# Patient Record
Sex: Female | Born: 1965
Health system: Southern US, Community
[De-identification: ages and names within clinical notes are randomized; demographics above are authoritative.]

## PROBLEM LIST (undated history)

## (undated) DIAGNOSIS — J449 Chronic obstructive pulmonary disease, unspecified: Secondary | ICD-10-CM

## (undated) DIAGNOSIS — Z720 Tobacco use: Secondary | ICD-10-CM

## (undated) DIAGNOSIS — K5792 Diverticulitis of intestine, part unspecified, without perforation or abscess without bleeding: Secondary | ICD-10-CM

## (undated) DIAGNOSIS — I2510 Atherosclerotic heart disease of native coronary artery without angina pectoris: Secondary | ICD-10-CM

## (undated) DIAGNOSIS — K648 Other hemorrhoids: Secondary | ICD-10-CM

## (undated) DIAGNOSIS — Z8679 Personal history of other diseases of the circulatory system: Secondary | ICD-10-CM

## (undated) DIAGNOSIS — R0602 Shortness of breath: Secondary | ICD-10-CM

## (undated) DIAGNOSIS — F1010 Alcohol abuse, uncomplicated: Secondary | ICD-10-CM

## (undated) DIAGNOSIS — E7800 Pure hypercholesterolemia, unspecified: Secondary | ICD-10-CM

## (undated) HISTORY — DX: Atherosclerotic heart disease of native coronary artery without angina pectoris: I25.10

## (undated) HISTORY — DX: Chronic obstructive pulmonary disease, unspecified: J44.9

## (undated) HISTORY — PX: OTHER SURGICAL HISTORY: SHX169

## (undated) HISTORY — DX: Other hemorrhoids: K64.8

## (undated) HISTORY — DX: Shortness of breath: R06.02

## (undated) HISTORY — DX: Diverticulitis of intestine, part unspecified, without perforation or abscess without bleeding: K57.92

## (undated) HISTORY — DX: Personal history of other diseases of the circulatory system: Z86.79

## (undated) HISTORY — DX: Alcohol abuse, uncomplicated: F10.10

## (undated) HISTORY — DX: Pure hypercholesterolemia, unspecified: E78.00

## (undated) HISTORY — DX: Tobacco use: Z72.0

---

## 2002-06-27 ENCOUNTER — Other Ambulatory Visit: Admission: RE | Admit: 2002-06-27 | Discharge: 2002-06-27 | Payer: Self-pay | Admitting: Family Medicine

## 2007-02-23 ENCOUNTER — Encounter: Admission: RE | Admit: 2007-02-23 | Discharge: 2007-02-23 | Payer: Self-pay | Admitting: Family Medicine

## 2008-06-12 ENCOUNTER — Encounter: Admission: RE | Admit: 2008-06-12 | Discharge: 2008-06-12 | Payer: Self-pay | Admitting: Family Medicine

## 2009-01-25 ENCOUNTER — Encounter: Admission: RE | Admit: 2009-01-25 | Discharge: 2009-01-25 | Payer: Self-pay | Admitting: Family Medicine

## 2009-02-01 ENCOUNTER — Encounter: Admission: RE | Admit: 2009-02-01 | Discharge: 2009-02-01 | Payer: Self-pay | Admitting: Family Medicine

## 2009-02-20 ENCOUNTER — Encounter: Admission: RE | Admit: 2009-02-20 | Discharge: 2009-02-20 | Payer: Self-pay | Admitting: Gastroenterology

## 2010-11-05 ENCOUNTER — Encounter
Admission: RE | Admit: 2010-11-05 | Discharge: 2010-11-05 | Payer: Self-pay | Source: Home / Self Care | Attending: Family Medicine | Admitting: Family Medicine

## 2011-12-04 ENCOUNTER — Ambulatory Visit: Payer: Self-pay | Admitting: Cardiology

## 2011-12-15 ENCOUNTER — Encounter: Payer: Self-pay | Admitting: Cardiology

## 2011-12-15 ENCOUNTER — Ambulatory Visit (INDEPENDENT_AMBULATORY_CARE_PROVIDER_SITE_OTHER): Payer: BC Managed Care – PPO | Admitting: Cardiology

## 2011-12-15 VITALS — BP 142/78 | HR 84 | Ht 62.0 in | Wt 124.0 lb

## 2011-12-15 DIAGNOSIS — R0609 Other forms of dyspnea: Secondary | ICD-10-CM | POA: Insufficient documentation

## 2011-12-15 DIAGNOSIS — R0600 Dyspnea, unspecified: Secondary | ICD-10-CM

## 2011-12-15 DIAGNOSIS — E785 Hyperlipidemia, unspecified: Secondary | ICD-10-CM | POA: Insufficient documentation

## 2011-12-15 DIAGNOSIS — I10 Essential (primary) hypertension: Secondary | ICD-10-CM

## 2011-12-15 DIAGNOSIS — E7800 Pure hypercholesterolemia, unspecified: Secondary | ICD-10-CM

## 2011-12-15 DIAGNOSIS — F1010 Alcohol abuse, uncomplicated: Secondary | ICD-10-CM

## 2011-12-15 DIAGNOSIS — R0989 Other specified symptoms and signs involving the circulatory and respiratory systems: Secondary | ICD-10-CM

## 2011-12-15 DIAGNOSIS — R0789 Other chest pain: Secondary | ICD-10-CM | POA: Insufficient documentation

## 2011-12-15 DIAGNOSIS — Z8249 Family history of ischemic heart disease and other diseases of the circulatory system: Secondary | ICD-10-CM | POA: Insufficient documentation

## 2011-12-15 DIAGNOSIS — F17200 Nicotine dependence, unspecified, uncomplicated: Secondary | ICD-10-CM

## 2011-12-15 DIAGNOSIS — Z720 Tobacco use: Secondary | ICD-10-CM | POA: Insufficient documentation

## 2011-12-15 HISTORY — DX: Hyperlipidemia, unspecified: E78.5

## 2011-12-15 NOTE — Progress Notes (Signed)
Carmon Ginsberg Date of Birth: [birth year] Medical Record #[id number]  History of Present Illness: Jeanne Harris is seen at the request of Dr. Kevan Ny for evaluation of chest pain and dyspnea. She is a 46 year old white female who has multiple cardiac risk factors. She is a heavy smoker. She has a history of hypertension and hyperlipidemia. She has a family history of early coronary disease. She has been evaluated by myself in the past with stress tests in 2004 and again in 2009 which were unremarkable. She states she continues to have episodes of chest pain. This is localized to the left lateral chest and shoulder. She does note that she gets short of breath very easily and that this has progressed. Her lifestyle is really very bad. She is smoking 2 packs per day. She admits that she is a heavy drinker drinking at least 6 beers a night. She travels extensively with her job and her diet is very high in fat and sodium. She is concerned about her cardiac risk.  No current outpatient prescriptions on file prior to visit.    Allergies  Allergen Reactions  . Other     Nitrous Oxide    Past Medical History  Diagnosis Date  . Chest pain   . History of hypertension   . Hypercholesterolemia     Mild  . Tobacco abuse   . Chronic bronchitis   . SOB (shortness of breath)     when having chest pain    No past surgical history on file.  History  Smoking status  . Current Everyday Smoker -- 2.0 packs/day  . Types: Cigarettes  Smokeless tobacco  . Not on file    History  Alcohol Use  . Yes    Family History  Problem Relation Age of Onset  . Cancer Mother     Breast Cancer  . Hypertension Mother   . Heart attack Father     X4  . Heart disease Father   . Emphysema Father   . Cancer Father     Prostate Cancer  . Hypertension Sister   . Hypertension Brother   . Hypertension Sister   . Thyroid disease Sister     Review of Systems: The review of systems is positive for inflammation of  her: She states she is under a lot of stress. She has a history of benign positional vertigo with occasional dizziness.  All other systems were reviewed and are negative.  Physical Exam: BP 142/78  Pulse 84  Ht 5\' 2"  (1.575 m)  Wt 124 lb (56.246 kg)  BMI 22.68 kg/m2 The patient is alert and oriented x 3.  The mood and affect are normal.  The skin is warm and dry.  Color is normal.  The HEENT exam reveals that the sclera are nonicteric.  The mucous membranes are moist.  The carotids are 2+ without bruits.  There is no thyromegaly.  There is no JVD.  The lungs are clear.  The chest wall is non tender.  The heart exam reveals a regular rate with a normal S1 and S2.  There are no murmurs, gallops, or rubs.  The PMI is not displaced.   Abdominal exam reveals good bowel sounds.  There is no guarding or rebound.  There is no hepatosplenomegaly or tenderness.  There are no masses.  Exam of the legs reveal no clubbing, cyanosis, or edema.  The legs are without rashes.  The distal pulses are intact.  Cranial nerves II - XII are  intact.  Motor and sensory functions are intact.  The gait is normal.  LABORATORY DATA: ECG today shows normal sinus rhythm with an incomplete right bundle branch block.  Assessment / Plan:

## 2011-12-15 NOTE — Patient Instructions (Signed)
We will schedule you for a stress Echo to evaluate your cardiac risk  You need to quit smoking and drinking alcohol.  Eat a heart healthy diet with a lot of fruits and vegetables, low red meats, and get plenty of exercise.

## 2011-12-15 NOTE — Assessment & Plan Note (Signed)
I stressed the importance of smoking sensation for her long-term health. I suspect that her recent symptoms of dyspnea are related to her chronic tobacco abuse more than her cardiac situation.

## 2011-12-15 NOTE — Assessment & Plan Note (Signed)
I reviewed the. component of a heart healthy diet with a diet rich in fruits and vegetables and less red meat.

## 2011-12-15 NOTE — Assessment & Plan Note (Signed)
Her symptoms of chest pain are atypical but she does have multiple cardiac risk factors and a very unhealthy lifestyle. I recommended a stress echo to reevaluate her cardiac risk. If this is negative then I stressed the importance of lifestyle modification.

## 2011-12-23 ENCOUNTER — Ambulatory Visit (HOSPITAL_COMMUNITY): Payer: BC Managed Care – PPO | Attending: Cardiovascular Disease

## 2011-12-23 ENCOUNTER — Ambulatory Visit (HOSPITAL_COMMUNITY): Payer: BC Managed Care – PPO | Attending: Cardiovascular Disease | Admitting: Radiology

## 2011-12-23 DIAGNOSIS — R072 Precordial pain: Secondary | ICD-10-CM

## 2011-12-23 DIAGNOSIS — R0600 Dyspnea, unspecified: Secondary | ICD-10-CM

## 2011-12-23 DIAGNOSIS — R0989 Other specified symptoms and signs involving the circulatory and respiratory systems: Secondary | ICD-10-CM

## 2011-12-23 DIAGNOSIS — Z8249 Family history of ischemic heart disease and other diseases of the circulatory system: Secondary | ICD-10-CM

## 2011-12-23 DIAGNOSIS — Z720 Tobacco use: Secondary | ICD-10-CM

## 2011-12-23 DIAGNOSIS — R0609 Other forms of dyspnea: Secondary | ICD-10-CM | POA: Insufficient documentation

## 2011-12-23 DIAGNOSIS — F17200 Nicotine dependence, unspecified, uncomplicated: Secondary | ICD-10-CM | POA: Insufficient documentation

## 2011-12-23 DIAGNOSIS — R0789 Other chest pain: Secondary | ICD-10-CM

## 2011-12-23 DIAGNOSIS — R079 Chest pain, unspecified: Secondary | ICD-10-CM | POA: Insufficient documentation

## 2012-03-04 ENCOUNTER — Other Ambulatory Visit: Payer: Self-pay | Admitting: Family Medicine

## 2012-03-04 DIAGNOSIS — Z1231 Encounter for screening mammogram for malignant neoplasm of breast: Secondary | ICD-10-CM

## 2012-03-14 ENCOUNTER — Ambulatory Visit
Admission: RE | Admit: 2012-03-14 | Discharge: 2012-03-14 | Disposition: A | Discharge status: Home / Self Care | Payer: BC Managed Care – PPO | Source: Ambulatory Visit | Attending: Family Medicine | Admitting: Family Medicine

## 2012-03-14 DIAGNOSIS — Z1231 Encounter for screening mammogram for malignant neoplasm of breast: Secondary | ICD-10-CM

## 2012-10-16 HISTORY — PX: DOBUTAMINE STRESS ECHO: SHX5426

## 2012-10-27 ENCOUNTER — Other Ambulatory Visit: Payer: Self-pay

## 2012-10-27 ENCOUNTER — Ambulatory Visit
Admission: RE | Admit: 2012-10-27 | Discharge: 2012-10-27 | Disposition: A | Discharge status: Home / Self Care | Payer: BC Managed Care – PPO | Source: Ambulatory Visit

## 2012-10-27 DIAGNOSIS — R109 Unspecified abdominal pain: Secondary | ICD-10-CM

## 2012-10-27 DIAGNOSIS — K5900 Constipation, unspecified: Secondary | ICD-10-CM

## 2012-10-27 MED ORDER — IOHEXOL 300 MG/ML  SOLN
100.0000 mL | Freq: Once | INTRAMUSCULAR | Status: AC | PRN
  Administered 2012-10-27: 100 mL via INTRAVENOUS

## 2013-06-27 ENCOUNTER — Other Ambulatory Visit: Payer: Self-pay

## 2013-06-27 DIAGNOSIS — Z1231 Encounter for screening mammogram for malignant neoplasm of breast: Secondary | ICD-10-CM

## 2013-07-10 ENCOUNTER — Ambulatory Visit
Admission: RE | Admit: 2013-07-10 | Discharge: 2013-07-10 | Disposition: A | Discharge status: Home / Self Care | Payer: BC Managed Care – PPO | Source: Ambulatory Visit

## 2013-07-10 DIAGNOSIS — Z1231 Encounter for screening mammogram for malignant neoplasm of breast: Secondary | ICD-10-CM

## 2013-09-18 IMAGING — CT CT ABD-PELV W/ CM
2 of 5 series · 17 of 46 positions shown, 19 images · IV contrast (READICAT/WATER & [ID] OMNI 300)
Comparison: 02/01/2009.

CLINICAL DATA: Abdominal pain. Left lower quadrant pain.

CT ABDOMEN AND PELVIS WITH CONTRAST
TECHNIQUE: Multidetector CT imaging of the abdomen and pelvis was
performed following the standard protocol during bolus
administration of intravenous contrast.
Contrast: 100mL OMNIPAQUE IOHEXOL 300 MG/ML  SOLN

[Series 2: abd/pelvis with · axial · 0.70mm/px · z∈[-380,-20]mm · 14 of 82 slices shown, 16 images]
[im 5/82  soft-tissue]
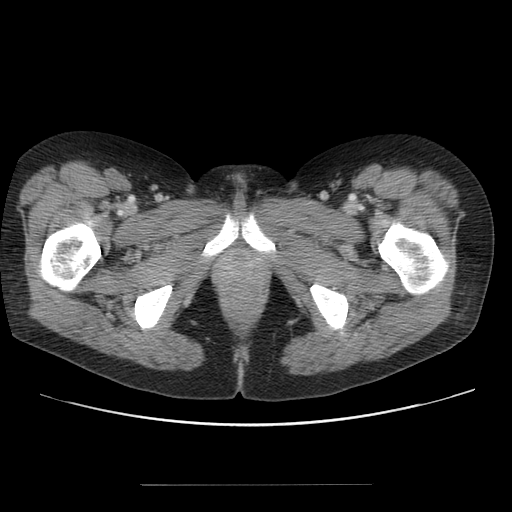
[im 5/82  bone]
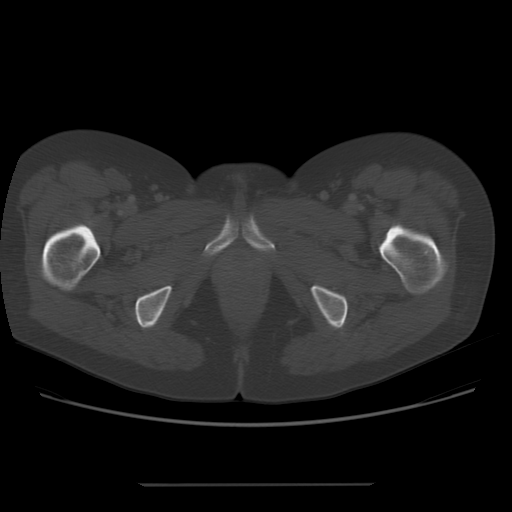
[im 9/82  soft-tissue]
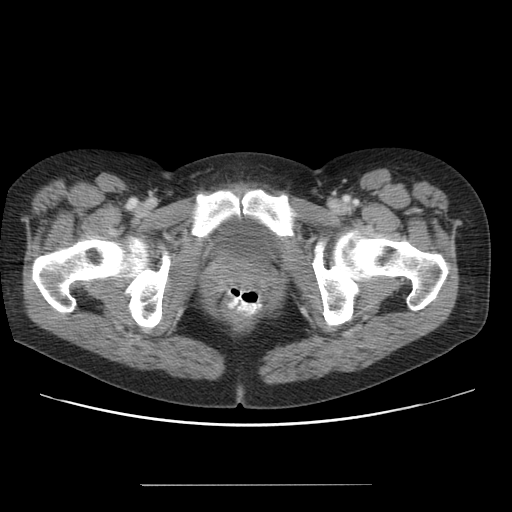
[im 18/82  soft-tissue]
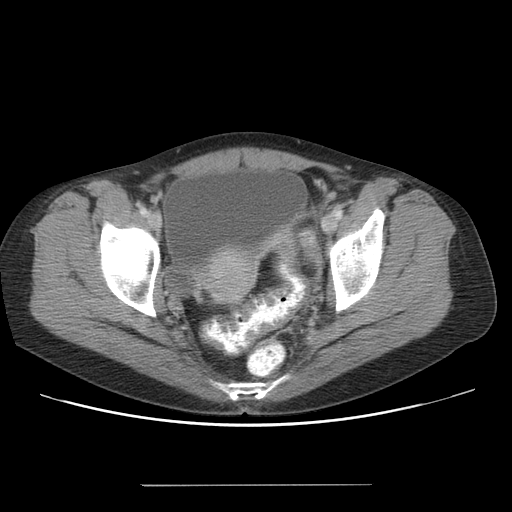
[im 22/82  soft-tissue]
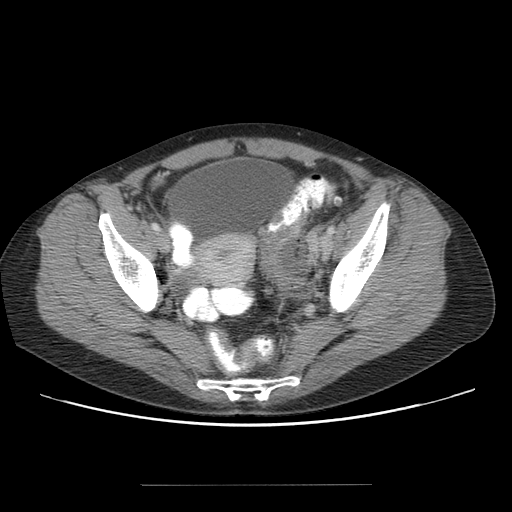
[im 26/82  soft-tissue]
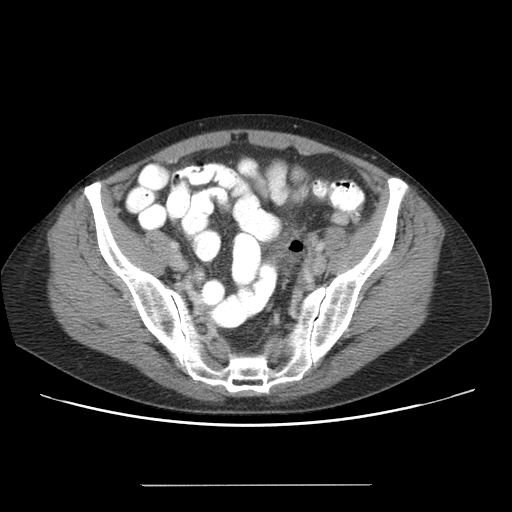
[im 35/82  soft-tissue]
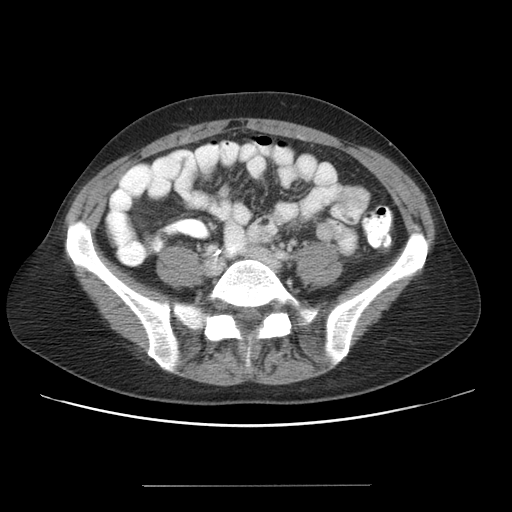
[im 39/82  soft-tissue]
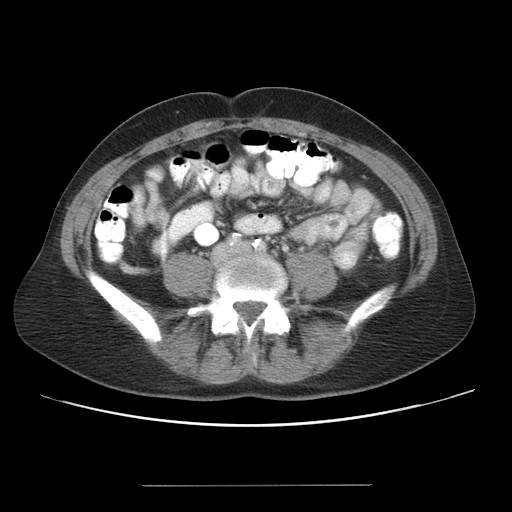
[im 43/82  soft-tissue]
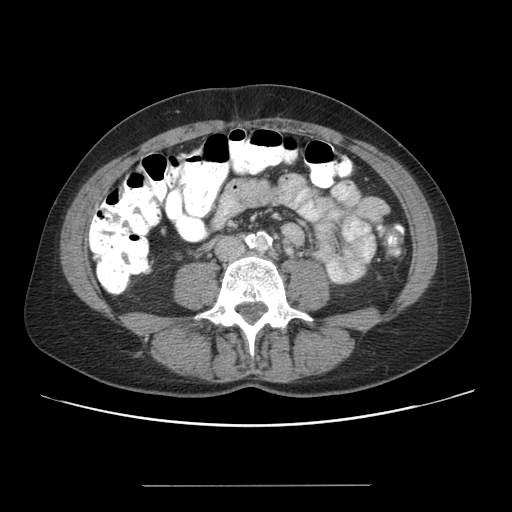
[im 47/82  soft-tissue]
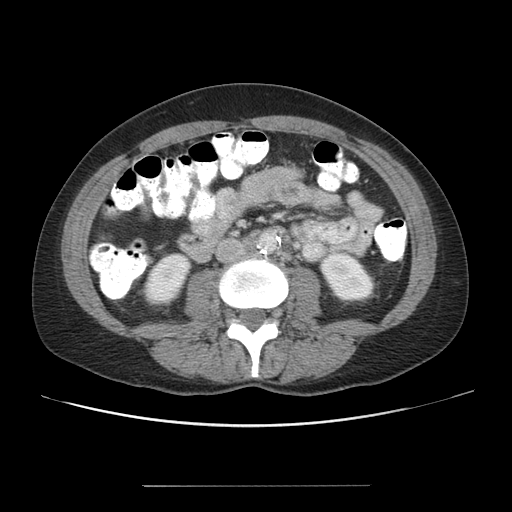
[im 47/82  bone]
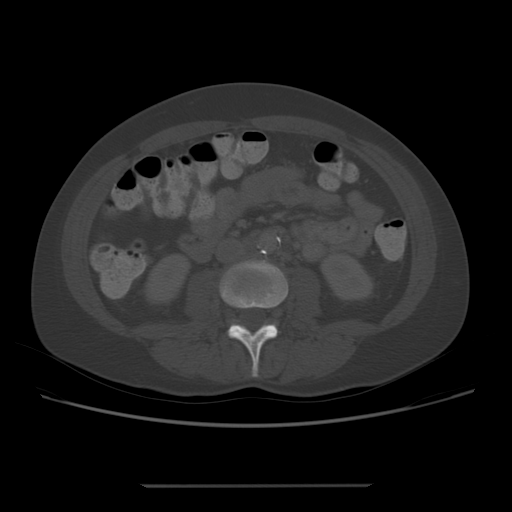
[im 56/82  soft-tissue]
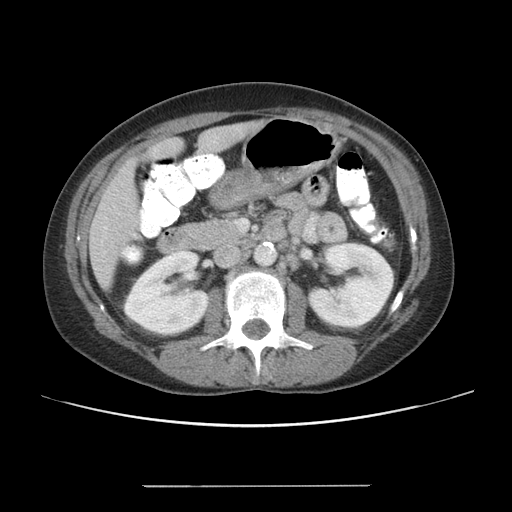
[im 60/82  soft-tissue]
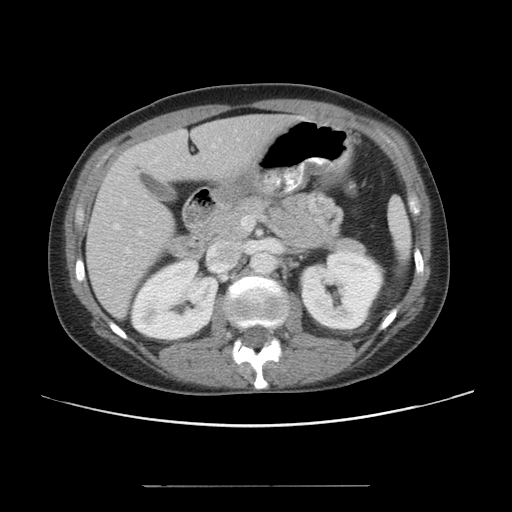
[im 64/82  soft-tissue]
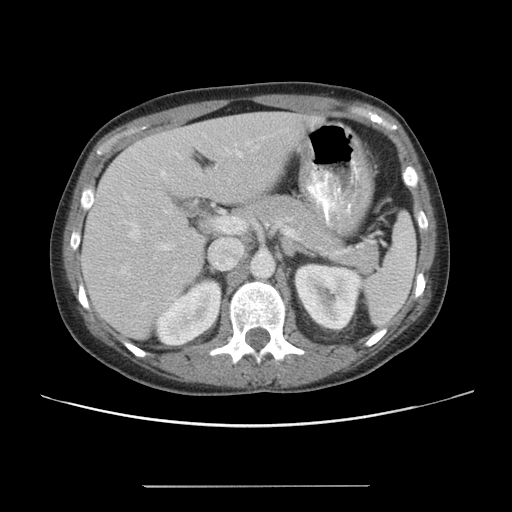
[im 73/82  soft-tissue]
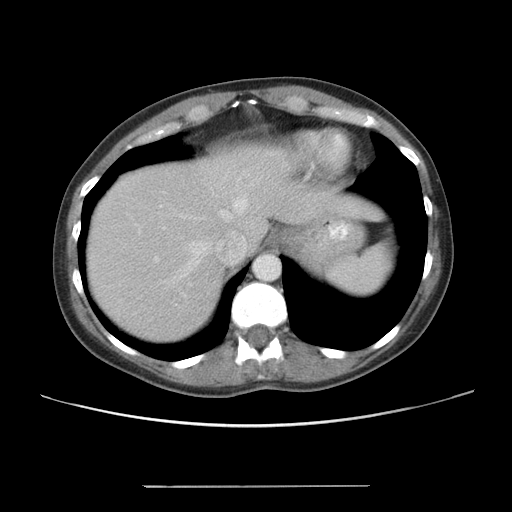
[im 77/82  soft-tissue]
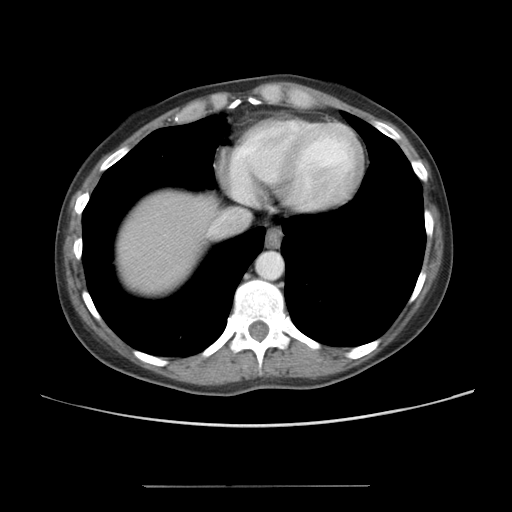

[Series 400: cor · coronal · 0.85mm/px · 3 of 104 slices shown]
[im 35/104  soft-tissue]
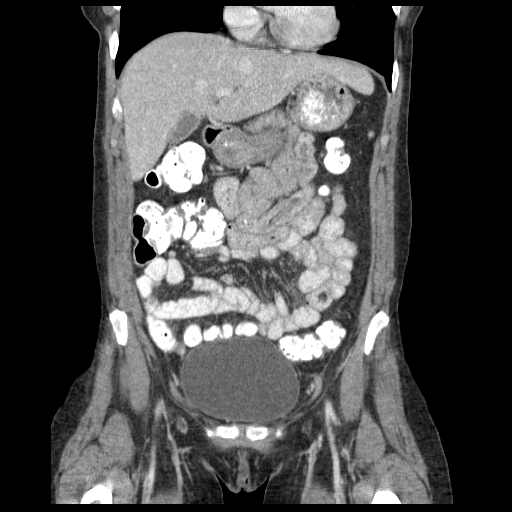
[im 46/104  soft-tissue]
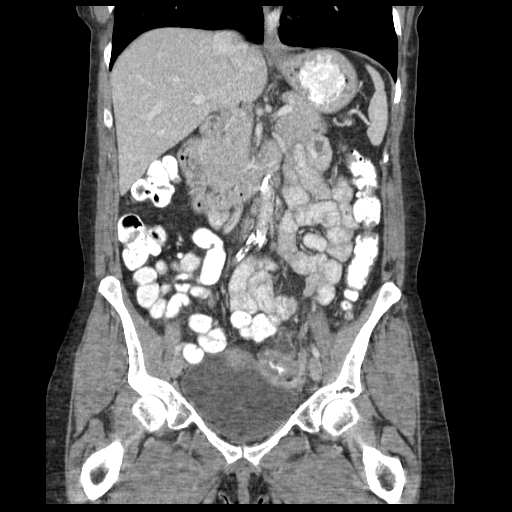
[im 58/104  soft-tissue]
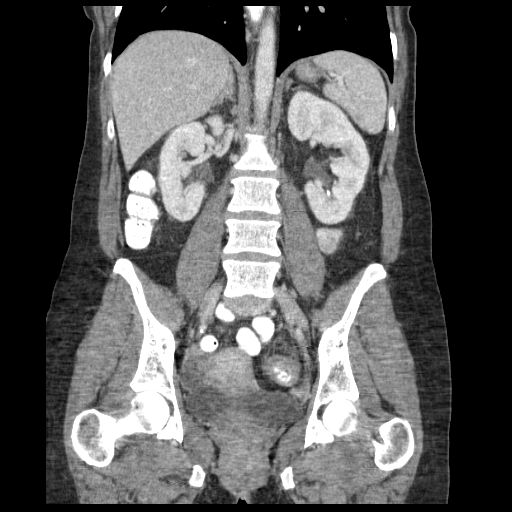

[17 of 46 positions shown; findings below may reference images not displayed]

FINDINGS: Lung bases are clear.  No effusions.  Heart is normal
size.

Liver, spleen, pancreas, stomach, adrenals are unremarkable.  Small
nonobstructing stone in the lower pole of the left kidney.  Tiny
hypodensities within the left kidney, likely small cysts.  No
hydronephrosis.  No ureteral stones.

There is a descending colonic and sigmoid diverticulosis.  Wall
thickening and surrounding inflammatory change about the sigmoid
colon compatible with active diverticulitis.  There is a focal area
of ill-defined fluid / phlegmon measuring 2.7 x 2.4 cm.  This
contains locules of air.  Findings compatible with developing
abscess adjacent to the sigmoid colon.

Uterus and adnexa are unremarkable.  The no free fluid or
adenopathy.  Aorta is calcified, non-aneurysmal.

Small bowel is decompressed.  Appendix is visualized and is normal.
IMPRESSION: Diverticulosis in the sigmoid and descending colon.  Active
diverticulitis in the sigmoid colon with adjacent phlegmon and
extraluminal air, compatible with developing abscess.

Left nephrolithiasis, stable.

These results were discussed with Dr. Asendua at the time of
interpretation.

## 2014-05-21 ENCOUNTER — Encounter: Payer: Self-pay | Admitting: Gynecology

## 2014-07-30 ENCOUNTER — Other Ambulatory Visit: Payer: Self-pay

## 2014-07-30 DIAGNOSIS — Z1231 Encounter for screening mammogram for malignant neoplasm of breast: Secondary | ICD-10-CM

## 2014-07-30 DIAGNOSIS — Z803 Family history of malignant neoplasm of breast: Secondary | ICD-10-CM

## 2014-08-09 ENCOUNTER — Encounter (INDEPENDENT_AMBULATORY_CARE_PROVIDER_SITE_OTHER): Payer: Self-pay

## 2014-08-09 ENCOUNTER — Ambulatory Visit
Admission: RE | Admit: 2014-08-09 | Discharge: 2014-08-09 | Disposition: A | Discharge status: Home / Self Care | Payer: BC Managed Care – PPO | Source: Ambulatory Visit

## 2014-08-09 DIAGNOSIS — Z1231 Encounter for screening mammogram for malignant neoplasm of breast: Secondary | ICD-10-CM

## 2014-08-09 DIAGNOSIS — Z803 Family history of malignant neoplasm of breast: Secondary | ICD-10-CM

## 2014-09-21 ENCOUNTER — Encounter: Payer: Self-pay | Admitting: Cardiology

## 2014-10-24 ENCOUNTER — Encounter: Payer: Self-pay | Admitting: Cardiology

## 2015-09-11 ENCOUNTER — Other Ambulatory Visit: Payer: Self-pay

## 2015-09-11 DIAGNOSIS — Z1231 Encounter for screening mammogram for malignant neoplasm of breast: Secondary | ICD-10-CM

## 2015-10-18 ENCOUNTER — Ambulatory Visit
Admission: RE | Admit: 2015-10-18 | Discharge: 2015-10-18 | Disposition: A | Discharge status: Home / Self Care | Payer: BLUE CROSS/BLUE SHIELD | Source: Ambulatory Visit

## 2015-10-18 DIAGNOSIS — Z1231 Encounter for screening mammogram for malignant neoplasm of breast: Secondary | ICD-10-CM

## 2016-01-13 ENCOUNTER — Other Ambulatory Visit: Payer: Self-pay | Admitting: Family Medicine

## 2016-01-13 ENCOUNTER — Ambulatory Visit
Admission: RE | Admit: 2016-01-13 | Discharge: 2016-01-13 | Disposition: A | Discharge status: Home / Self Care | Payer: BLUE CROSS/BLUE SHIELD | Source: Ambulatory Visit | Attending: Family Medicine | Admitting: Family Medicine

## 2016-01-13 DIAGNOSIS — R05 Cough: Secondary | ICD-10-CM

## 2016-01-13 DIAGNOSIS — R0602 Shortness of breath: Secondary | ICD-10-CM

## 2016-01-13 DIAGNOSIS — R059 Cough, unspecified: Secondary | ICD-10-CM

## 2016-01-13 DIAGNOSIS — F17200 Nicotine dependence, unspecified, uncomplicated: Secondary | ICD-10-CM

## 2016-06-25 ENCOUNTER — Other Ambulatory Visit: Payer: Self-pay | Admitting: Family Medicine

## 2016-06-25 ENCOUNTER — Ambulatory Visit
Admission: RE | Admit: 2016-06-25 | Discharge: 2016-06-25 | Disposition: A | Discharge status: Home / Self Care | Payer: BLUE CROSS/BLUE SHIELD | Source: Ambulatory Visit | Attending: Family Medicine | Admitting: Family Medicine

## 2016-06-25 DIAGNOSIS — R0989 Other specified symptoms and signs involving the circulatory and respiratory systems: Secondary | ICD-10-CM

## 2016-11-11 ENCOUNTER — Other Ambulatory Visit: Payer: Self-pay | Admitting: Family Medicine

## 2016-11-11 DIAGNOSIS — Z1231 Encounter for screening mammogram for malignant neoplasm of breast: Secondary | ICD-10-CM

## 2016-12-08 ENCOUNTER — Encounter: Payer: Self-pay | Admitting: Obstetrics & Gynecology

## 2016-12-09 ENCOUNTER — Ambulatory Visit: Payer: BLUE CROSS/BLUE SHIELD

## 2016-12-09 ENCOUNTER — Ambulatory Visit
Admission: RE | Admit: 2016-12-09 | Discharge: 2016-12-09 | Disposition: A | Discharge status: Home / Self Care | Payer: BLUE CROSS/BLUE SHIELD | Source: Ambulatory Visit | Attending: Family Medicine | Admitting: Family Medicine

## 2016-12-09 DIAGNOSIS — Z1231 Encounter for screening mammogram for malignant neoplasm of breast: Secondary | ICD-10-CM

## 2017-02-09 ENCOUNTER — Encounter: Payer: Self-pay | Admitting: Obstetrics & Gynecology

## 2017-04-15 ENCOUNTER — Encounter: Payer: Self-pay | Admitting: Obstetrics & Gynecology

## 2017-05-11 ENCOUNTER — Encounter: Payer: Self-pay | Admitting: Obstetrics & Gynecology

## 2017-05-20 ENCOUNTER — Encounter: Payer: Self-pay | Admitting: Obstetrics & Gynecology

## 2017-05-24 ENCOUNTER — Other Ambulatory Visit (HOSPITAL_COMMUNITY)
Admission: RE | Admit: 2017-05-24 | Discharge: 2017-05-24 | Disposition: A | Discharge status: Home / Self Care | Payer: BLUE CROSS/BLUE SHIELD | Source: Ambulatory Visit | Attending: Obstetrics & Gynecology | Admitting: Obstetrics & Gynecology

## 2017-05-24 ENCOUNTER — Ambulatory Visit (INDEPENDENT_AMBULATORY_CARE_PROVIDER_SITE_OTHER): Payer: BLUE CROSS/BLUE SHIELD | Admitting: Obstetrics & Gynecology

## 2017-05-24 ENCOUNTER — Encounter: Payer: Self-pay | Admitting: Obstetrics & Gynecology

## 2017-05-24 VITALS — BP 163/96 | HR 75 | Resp 14 | Ht 62.0 in | Wt 128.1 lb

## 2017-05-24 DIAGNOSIS — K5730 Diverticulosis of large intestine without perforation or abscess without bleeding: Secondary | ICD-10-CM

## 2017-05-24 DIAGNOSIS — F17200 Nicotine dependence, unspecified, uncomplicated: Secondary | ICD-10-CM | POA: Diagnosis not present

## 2017-05-24 DIAGNOSIS — Z9189 Other specified personal risk factors, not elsewhere classified: Secondary | ICD-10-CM

## 2017-05-24 DIAGNOSIS — Z800 Family history of malignant neoplasm of digestive organs: Secondary | ICD-10-CM | POA: Diagnosis not present

## 2017-05-24 DIAGNOSIS — Z803 Family history of malignant neoplasm of breast: Secondary | ICD-10-CM

## 2017-05-24 DIAGNOSIS — Z01419 Encounter for gynecological examination (general) (routine) without abnormal findings: Secondary | ICD-10-CM | POA: Diagnosis not present

## 2017-05-24 DIAGNOSIS — Z124 Encounter for screening for malignant neoplasm of cervix: Secondary | ICD-10-CM

## 2017-05-24 DIAGNOSIS — J449 Chronic obstructive pulmonary disease, unspecified: Secondary | ICD-10-CM | POA: Diagnosis not present

## 2017-05-24 NOTE — Patient Instructions (Signed)
Dr. Vassie LollAlva.  [facility] on [address]. Remember to get a tetenus shot if you have an exposure.

## 2017-05-24 NOTE — Progress Notes (Signed)
51 y.o. G0P0000 SingleCaucasianF here for annual exam.  Partner of existing patient of mind.  PMP.  No HRT use.  Has not had a cycle in two years.  Does have hot flashes and night sweats.  Absolutely not interested in HRT or other medication at this time for treatment of these symptoms.  She is also having some libido issues.  Treatment options reviewed depending on blood work.  Pt declines this as well.  Patient has strong family history of breast cancer--mother, 3 maternal aunts, and one maternal cousin.  No one has done genetic testing that she is aware of at this time.  Pt has never had lifetime risk for breast cancer performed.  Decided to proceed with this today with pt.  Tyrer-Cusick assessment for breat cancer performed showing 24.5% lifetime risks of breat cancer.  Dondra Spry model is 18% risk (likely due to fact that pt has only one first degree relative with breast cancer.)  Pt has hx of LLQ pain back about 9 years ago.  Reports she saw Dr. Bosie Clos.  Attempt at colonoscopy was done.  Unsuccessful colonoscopy associated with severe pain for patient.  Diverticulitis ultimately diagnosed with CT scan.  Was treated with antibiotics and this resolved the pain.  Had a second episode of diverticulitis in 2013.  CT scan result reviewed in EPIC.  Pt reports ever since the first episode, her bowel movement shapes have been different--like there is something partially obstructing bowel movement.  Aware she should have colonoscopy.  Very anxious about this due to pain with previous one (or attempted one).  No LMP recorded (within years). Patient is not currently having periods (Reason: Other).          Sexually active: Yes.    The current method of family planning is none.    Exercising: No.  The patient does not participate in regular exercise at present. Smoker:  yes  Health Maintenance: Pap:  >5 years  History of abnormal Pap:  unsure MMG:  12/09/16 BIRADS 1 negative  Colonoscopy:  9 years ago with Dr.  Bosie Clos, patient states they were unable to complete exam  BMD:   never TDaP:  Unsure, but > 10 years.  Declines. Pneumonia vaccine(s):  never Zostavax:   never Hep C testing: not indicated Screening Labs: PCP, Hb today: PCP   reports that she has been smoking Cigarettes.  She has been smoking about 2.00 packs per day. She has never used smokeless tobacco. She reports that she drinks alcohol. She reports that she does not use drugs.  Past Medical History:  Diagnosis Date  . Chest pain   . Chronic bronchitis   . COPD (chronic obstructive pulmonary disease) (HCC)    pt states diagnosed by Dr. Kevan Ny  . Diverticulitis   . History of hypertension   . Hypercholesterolemia    Mild  . SOB (shortness of breath)    when having chest pain  . Tobacco abuse     History reviewed. No pertinent surgical history.  Current Outpatient Prescriptions  Medication Sig Dispense Refill  . amLODipine (NORVASC) 10 MG tablet Take 10 mg by mouth every morning.  4  . BYSTOLIC 20 MG TABS every evening.  4  . Cholecalciferol (VITAMIN D PO) Take by mouth daily.    . Ibuprofen (ADVIL PO) Take by mouth as needed.     No current facility-administered medications for this visit.     Family History  Problem Relation Age of Onset  . Cancer Mother  Breast Cancer  . Hypertension Mother   . Heart attack Father        X4  . Heart disease Father   . Emphysema Father   . Cancer Father        Prostate Cancer  . Pulmonary disease Father   . Hypertension Sister   . Hypertension Brother   . Hypertension Sister   . Thyroid disease Sister   . Cancer Maternal Aunt        breast cancer  . Parkinson's disease Maternal Aunt   . Cancer Maternal Grandmother        Colon   . Cancer Maternal Aunt        breast cancer  . Cancer Cousin        breast cancer  . Cancer Maternal Aunt        breast cancer  . Cancer Maternal Aunt        breast cancer     ROS:  Pertinent items are noted in HPI.  Otherwise, a  comprehensive ROS was negative.  Exam:   BP (!) 163/96 (BP Location: Left Arm, Patient Position: Sitting, Cuff Size: Normal) Comment: patient states she is anxious  Pulse 75   Resp 14   Ht 5\' 2"  (1.575 m)   Wt 128 lb 1.1 oz (58.1 kg)   LMP  (Within Years) Comment: pt states >2 years  BMI 23.42 kg/m      Height: 5\' 2"  (157.5 cm)  Ht Readings from Last 3 Encounters:  05/24/17 5\' 2"  (1.575 m)  12/15/11 5\' 2"  (1.575 m)    General appearance: alert, cooperative and appears stated age Head: Normocephalic, without obvious abnormality, atraumatic Neck: no adenopathy, supple, symmetrical, trachea midline and thyroid normal to inspection and palpation Lungs: clear to auscultation bilaterally Breasts: normal appearance, no masses or tenderness Heart: regular rate and rhythm Abdomen: soft, non-tender; bowel sounds normal; no masses,  no organomegaly Extremities: extremities normal, atraumatic, no cyanosis or edema Skin: Skin color, texture, turgor normal. No rashes or lesions Lymph nodes: Cervical, supraclavicular, and axillary nodes normal. No abnormal inguinal nodes palpated Neurologic: Grossly normal   Pelvic: External genitalia:  no lesions              Urethra:  normal appearing urethra with no masses, tenderness or lesions              Bartholins and Skenes: normal                 Vagina: normal appearing vagina with normal color and discharge, no lesions              Cervix: no lesions              Pap taken: Yes.   Bimanual Exam:  Uterus:  normal size, contour, position, consistency, mobility, non-tender              Adnexa: normal adnexa and no mass, fullness, tenderness               Rectovaginal: Declined               Anus:  no lesions  Chaperone was present for exam.  A:  Well Woman with normal exam Chronic smoker.  Declines considering cessation. COPD Lapse in gyn care H/O diverticulosis and two episodes of diverticulitis Strong family hx of breast cancer with  elevated lifetime risk of breast cancer Family hx of colon cancer in [place]  P:   Mammogram guidelines  reviewed.  Recommended doing 3D MMG.  Recommended MRI yearly due to increased lifetime risk of breast cancer.  Declines genetic testing.  Not sure she wants yearly MRI but willing to do one now. Pap smear and HR HPV obtained today Declined Tdap update today Pulmnology referral recommended.  She declines doing referral at this time. Release of colonoscopy records from Dr. Bosie Clos.  Cologuard paperwork signed today.  Declines repeat colonoscopy at this time.  She is aware colonoscopy is gold standard for testing but I think doing something is better than nothing and she agrees to the cologuard. return annually or prn  Lengthy visit with pt today that lasted approximately one hour with >50% of this time discussing her family hx of breast and colon cancer, prior hx of diverticulitis and her anxiety about colon disease but refusal to proceed with colonoscopy, smoking hx and COPD.  This was in excess of the time spent reviewing history, discussing recommended screening tests based on age, and performing physical exam.

## 2017-05-25 ENCOUNTER — Telehealth: Payer: Self-pay

## 2017-05-25 DIAGNOSIS — Z9189 Other specified personal risk factors, not elsewhere classified: Secondary | ICD-10-CM

## 2017-05-25 DIAGNOSIS — Z803 Family history of malignant neoplasm of breast: Secondary | ICD-10-CM

## 2017-05-25 NOTE — Telephone Encounter (Signed)
Order for bilateral breast MRI w wo contrast placed for pre-authorization before scheduling.

## 2017-05-25 NOTE — Telephone Encounter (Signed)
-----   Message from Jerene BearsMary S Miller, MD sent at 05/24/2017  5:52 PM EDT ----- Regarding: Pt needs breast MRI scheduled Pt has increased lifetime hx of breast cancer with 24.5% lifetime risk of breast cancer.  Her mother, three maternal aunts, and one maternal cousin have all had breast cancer.  No one has done genetic testing.  Last MMG was in January.  I want to precert MRI and proceed with scheduling. Thanks.  Rosalita ChessmanSuzanne

## 2017-05-26 LAB — CYTOLOGY - PAP
Diagnosis: NEGATIVE
HPV: NOT DETECTED

## 2017-06-23 NOTE — Telephone Encounter (Signed)
Patient returned call. Spoke with patient in regards to recommended Breast MRI. Patient acknowledges she has not returned [facility] to [facility]. Patient states she "travels with work and only has four down weeks the rest of the year". Patient is not sure when she will be able to schedule, but will review her schedule and contact [facility] to make appointment.  Routing to Dr Hyacinth MeekerMiller  cc: Yvonna AlanisKaitlyn Sprague

## 2017-06-23 NOTE — Telephone Encounter (Signed)
Thanks for update

## 2017-06-23 NOTE — Telephone Encounter (Signed)
Breast MRI has been approved. Authorization number is [id number] and is valid through 07/22/17. Per Jeanne Harris with [facility], they have left voicemail messages requesting a return call on 05/27/17 and on 06/03/17. Patient has not returned their calls for scheduling.  I have placed a call to the patient and left a voicemail message, requesting a return call. Upon return call, will encourage patient to [facility] to schedule the recommended Breast MRI. [facility] will need to contact [facility] at [phone].  Routing to Jeanne Harris  cc: Jeanne Harris

## 2017-06-29 ENCOUNTER — Telehealth: Payer: Self-pay | Admitting: Obstetrics & Gynecology

## 2017-06-29 NOTE — Telephone Encounter (Signed)
Spoke with patient. Patient states that she has a terrible phobia of needles and does not want to proceed with breast MRI at this time. "I do not have the courage. I have a terrible phobia and almost have to be sedated for blood draws. They told me I would need an IV for contrast." Declines to schedule at this time due to concerns.

## 2017-06-29 NOTE — Telephone Encounter (Signed)
That is ok.  It is her decision to make.  Ok to remove from imaging hold as well.  Ok to close encounter.

## 2017-06-29 NOTE — Telephone Encounter (Signed)
Patient called states she has decided not to have the MRI at this time because " they use needles for the contrast".

## 2017-07-05 ENCOUNTER — Telehealth: Payer: Self-pay | Admitting: *Deleted

## 2017-07-05 ENCOUNTER — Other Ambulatory Visit: Payer: Self-pay | Admitting: *Deleted

## 2017-07-05 DIAGNOSIS — Z1211 Encounter for screening for malignant neoplasm of colon: Secondary | ICD-10-CM

## 2017-07-05 LAB — COLOGUARD: COLOGUARD: NEGATIVE

## 2017-07-05 NOTE — Telephone Encounter (Signed)
Detailed message left per DPR on cell number: [phone] with negative cologuard results. Advised patient to return call to office if she had any additional questions.   Will close encounter.

## 2017-08-19 ENCOUNTER — Other Ambulatory Visit: Payer: BLUE CROSS/BLUE SHIELD | Admitting: Obstetrics & Gynecology

## 2017-08-19 ENCOUNTER — Other Ambulatory Visit: Payer: BLUE CROSS/BLUE SHIELD

## 2018-05-17 DIAGNOSIS — Z79899 Other long term (current) drug therapy: Secondary | ICD-10-CM | POA: Diagnosis not present

## 2018-05-17 DIAGNOSIS — E559 Vitamin D deficiency, unspecified: Secondary | ICD-10-CM | POA: Diagnosis not present

## 2018-05-17 DIAGNOSIS — I10 Essential (primary) hypertension: Secondary | ICD-10-CM | POA: Diagnosis not present

## 2018-05-23 DIAGNOSIS — E559 Vitamin D deficiency, unspecified: Secondary | ICD-10-CM | POA: Diagnosis not present

## 2018-05-23 DIAGNOSIS — Z79899 Other long term (current) drug therapy: Secondary | ICD-10-CM | POA: Diagnosis not present

## 2018-05-23 DIAGNOSIS — I10 Essential (primary) hypertension: Secondary | ICD-10-CM | POA: Diagnosis not present

## 2018-09-05 ENCOUNTER — Other Ambulatory Visit: Payer: Self-pay | Admitting: Family Medicine

## 2018-09-05 DIAGNOSIS — Z1231 Encounter for screening mammogram for malignant neoplasm of breast: Secondary | ICD-10-CM

## 2018-09-08 ENCOUNTER — Ambulatory Visit
Admission: RE | Admit: 2018-09-08 | Discharge: 2018-09-08 | Disposition: A | Discharge status: Home / Self Care | Payer: Commercial Managed Care - PPO | Source: Ambulatory Visit | Attending: Family Medicine | Admitting: Family Medicine

## 2018-09-08 DIAGNOSIS — Z1231 Encounter for screening mammogram for malignant neoplasm of breast: Secondary | ICD-10-CM

## 2018-12-13 DIAGNOSIS — J00 Acute nasopharyngitis [common cold]: Secondary | ICD-10-CM | POA: Diagnosis not present

## 2018-12-13 DIAGNOSIS — B359 Dermatophytosis, unspecified: Secondary | ICD-10-CM | POA: Diagnosis not present

## 2018-12-31 DIAGNOSIS — H018 Other specified inflammations of eyelid: Secondary | ICD-10-CM | POA: Diagnosis not present

## 2018-12-31 DIAGNOSIS — L853 Xerosis cutis: Secondary | ICD-10-CM | POA: Diagnosis not present

## 2018-12-31 DIAGNOSIS — B354 Tinea corporis: Secondary | ICD-10-CM | POA: Diagnosis not present

## 2019-01-03 DIAGNOSIS — L309 Dermatitis, unspecified: Secondary | ICD-10-CM | POA: Diagnosis not present

## 2019-01-03 DIAGNOSIS — K130 Diseases of lips: Secondary | ICD-10-CM | POA: Diagnosis not present

## 2019-01-03 DIAGNOSIS — Z23 Encounter for immunization: Secondary | ICD-10-CM | POA: Diagnosis not present

## 2020-06-18 DIAGNOSIS — Z0000 Encounter for general adult medical examination without abnormal findings: Secondary | ICD-10-CM | POA: Diagnosis not present

## 2020-09-04 ENCOUNTER — Other Ambulatory Visit: Payer: Self-pay | Admitting: Family Medicine

## 2020-09-04 DIAGNOSIS — Z1231 Encounter for screening mammogram for malignant neoplasm of breast: Secondary | ICD-10-CM

## 2020-10-08 ENCOUNTER — Ambulatory Visit
Admission: RE | Admit: 2020-10-08 | Discharge: 2020-10-08 | Disposition: A | Discharge status: Home / Self Care | Payer: BC Managed Care – PPO | Source: Ambulatory Visit | Attending: Family Medicine | Admitting: Family Medicine

## 2020-10-08 ENCOUNTER — Other Ambulatory Visit: Payer: Self-pay

## 2020-10-08 DIAGNOSIS — Z1231 Encounter for screening mammogram for malignant neoplasm of breast: Secondary | ICD-10-CM | POA: Diagnosis not present

## 2021-03-24 DIAGNOSIS — L03116 Cellulitis of left lower limb: Secondary | ICD-10-CM | POA: Diagnosis not present

## 2021-06-02 ENCOUNTER — Telehealth (HOSPITAL_BASED_OUTPATIENT_CLINIC_OR_DEPARTMENT_OTHER): Payer: Self-pay | Admitting: Obstetrics & Gynecology

## 2021-06-02 NOTE — Telephone Encounter (Signed)
Patient called said she returning Kim called .

## 2021-06-02 NOTE — Telephone Encounter (Signed)
Pt informed that Cologuard testing was overdue. Pt has made an appt to see Dr. Hyacinth Meeker.

## 2021-07-01 DIAGNOSIS — Z79899 Other long term (current) drug therapy: Secondary | ICD-10-CM | POA: Diagnosis not present

## 2021-07-01 DIAGNOSIS — E559 Vitamin D deficiency, unspecified: Secondary | ICD-10-CM | POA: Diagnosis not present

## 2021-07-01 DIAGNOSIS — I10 Essential (primary) hypertension: Secondary | ICD-10-CM | POA: Diagnosis not present

## 2021-07-01 DIAGNOSIS — E7800 Pure hypercholesterolemia, unspecified: Secondary | ICD-10-CM | POA: Diagnosis not present

## 2021-07-01 DIAGNOSIS — Z0000 Encounter for general adult medical examination without abnormal findings: Secondary | ICD-10-CM | POA: Diagnosis not present

## 2021-07-09 ENCOUNTER — Other Ambulatory Visit (HOSPITAL_BASED_OUTPATIENT_CLINIC_OR_DEPARTMENT_OTHER): Payer: Self-pay | Admitting: Family Medicine

## 2021-07-09 DIAGNOSIS — E7800 Pure hypercholesterolemia, unspecified: Secondary | ICD-10-CM

## 2021-07-14 ENCOUNTER — Other Ambulatory Visit (HOSPITAL_COMMUNITY)
Admission: RE | Admit: 2021-07-14 | Discharge: 2021-07-14 | Disposition: A | Discharge status: Home / Self Care | Payer: BC Managed Care – PPO | Source: Ambulatory Visit | Attending: Obstetrics & Gynecology | Admitting: Obstetrics & Gynecology

## 2021-07-14 ENCOUNTER — Ambulatory Visit (INDEPENDENT_AMBULATORY_CARE_PROVIDER_SITE_OTHER): Payer: BC Managed Care – PPO | Admitting: Obstetrics & Gynecology

## 2021-07-14 ENCOUNTER — Other Ambulatory Visit: Payer: Self-pay

## 2021-07-14 ENCOUNTER — Encounter (HOSPITAL_BASED_OUTPATIENT_CLINIC_OR_DEPARTMENT_OTHER): Payer: Self-pay | Admitting: Obstetrics & Gynecology

## 2021-07-14 VITALS — BP 168/85 | HR 76 | Ht 62.0 in | Wt 121.6 lb

## 2021-07-14 DIAGNOSIS — K648 Other hemorrhoids: Secondary | ICD-10-CM

## 2021-07-14 DIAGNOSIS — Z124 Encounter for screening for malignant neoplasm of cervix: Secondary | ICD-10-CM | POA: Diagnosis not present

## 2021-07-14 DIAGNOSIS — R195 Other fecal abnormalities: Secondary | ICD-10-CM | POA: Diagnosis not present

## 2021-07-14 DIAGNOSIS — Z1211 Encounter for screening for malignant neoplasm of colon: Secondary | ICD-10-CM | POA: Diagnosis not present

## 2021-07-14 NOTE — Progress Notes (Signed)
GYNECOLOGY  VISIT  CC:   rectal bleeding  HPI: 55 y.o. G0P0000 Single White or Caucasian female here for complaint of rectal bleeding due to what she thinks is hemorrhoids.  Reports she feels the symptoms are better in the morning, like the tissue retracts overnight.  Then she typically as a BM in the morning and the hemorrhoids protrudes.  Then throughout the day it just gets worse.  There is pain and bleeding with almost every bowel movement.  Has used some topical products (mostly hydrocortisone 1%) but nothing really helps.  The bleeding is every day.  She does take Advil sometimes.    H/o incomplete colonoscopy 2018.  We have discussed screening in the past with repeat colonoscopy or cologuard.  Did have negative colonoscopy in 2018.  Declines colonoscopy but willing to repeat cologuard at this time.  Separately, she is having a coronary CT this week.  Long hx of smoking and h/o elevated lipids.  I have seen pt in the past but last visit was in 2018.  GYNECOLOGIC HISTORY: Patient's last menstrual period was 07/03/2013. Contraception: PMP Menopausal hormone therapy: none  Patient Active Problem List   Diagnosis Date Noted   Chest pain, atypical 12/15/2011   Dyspnea 12/15/2011   HTN (hypertension) 12/15/2011   Hypercholesteremia 12/15/2011   Family history of early CAD 12/15/2011   Tobacco abuse 12/15/2011   Alcohol abuse 12/15/2011    Past Medical History:  Diagnosis Date   Chest pain    Chronic bronchitis    COPD (chronic obstructive pulmonary disease) (HCC)    pt states diagnosed by Dr. Kevan Ny   Diverticulitis    History of hypertension    Hypercholesterolemia    Mild   SOB (shortness of breath)    when having chest pain   Tobacco abuse     No past surgical history on file.  MEDS:   Current Outpatient Medications on File Prior to Visit  Medication Sig Dispense Refill   amLODipine (NORVASC) 10 MG tablet Take 10 mg by mouth every morning.  4   BYSTOLIC 20 MG TABS  every evening.  4   Ibuprofen (ADVIL PO) Take by mouth as needed.     Cholecalciferol (VITAMIN D PO) Take by mouth daily. (Patient not taking: Reported on 07/14/2021)     No current facility-administered medications on file prior to visit.    ALLERGIES: Other and Oyster shell  Family History  Problem Relation Age of Onset   Cancer Mother        Breast Cancer   Hypertension Mother    Breast cancer Mother    Heart attack Father        X4   Heart disease Father    Emphysema Father    Cancer Father        Prostate Cancer   Pulmonary disease Father    Hypertension Sister    Hypertension Brother    Hypertension Sister    Thyroid disease Sister    Cancer Maternal Aunt        breast cancer   Parkinson's disease Maternal Aunt    Breast cancer Maternal Aunt    Cancer Maternal Grandmother        Colon    Cancer Maternal Aunt        breast cancer   Breast cancer Maternal Aunt    Cancer Cousin        breast cancer   Cancer Maternal Aunt        breast cancer  Cancer Maternal Aunt        breast cancer     SH:  partnered, non smoker  Review of Systems  Gastrointestinal:  Positive for blood in stool and rectal pain.  Genitourinary:  Negative for dysuria, urgency, vaginal bleeding and vaginal discharge.   PHYSICAL EXAMINATION:    BP (!) 168/85 (BP Location: Right Arm, Patient Position: Sitting, Cuff Size: Small)   Pulse 76   Ht 5\' 2"  (1.575 m) Comment: reported  Wt 121 lb 9.6 oz (55.2 kg)   LMP 07/03/2013   BMI 22.24 kg/m     General appearance: alert, cooperative and appears stated age Lymph:  no inguinal LAD noted  Pelvic: External genitalia:  no lesions              Urethra:  normal appearing urethra with no masses, tenderness or lesions              Bartholins and Skenes: normal                 Vagina: normal appearing vagina with normal color and discharge, no lesions              Cervix: no lesions              Bimanual Exam:  Uterus:  normal size, contour,  position, consistency, mobility, non-tender              Adnexa: no mass, fullness, tenderness              Rectovaginal: Yes.  .  Confirms.              Anus:  normal sphincter tone, anterior prolapsed hemorrhoid present with visible rectal mucosa present  Chaperone, 07/05/2013, CMA, was present for exam.  Assessment/Plan: 1. Hemorrhoid prolapse - due to pain and bleeding, feel surgical treatment is indicated - Ambulatory referral to General Surgery - additional OTC products discussed  2. Cervical cancer screening - pt overdue for pap smear so obtained today - Cytology - PAP( [facility])  3. Colon cancer screening - Cologuard

## 2021-07-15 ENCOUNTER — Ambulatory Visit (HOSPITAL_BASED_OUTPATIENT_CLINIC_OR_DEPARTMENT_OTHER)
Admission: RE | Admit: 2021-07-15 | Discharge: 2021-07-15 | Disposition: A | Discharge status: Home / Self Care | Payer: BC Managed Care – PPO | Source: Ambulatory Visit | Attending: Family Medicine | Admitting: Family Medicine

## 2021-07-15 DIAGNOSIS — E7800 Pure hypercholesterolemia, unspecified: Secondary | ICD-10-CM | POA: Insufficient documentation

## 2021-07-15 HISTORY — PX: OTHER SURGICAL HISTORY: SHX169

## 2021-07-16 LAB — CYTOLOGY - PAP
Comment: NEGATIVE
Diagnosis: NEGATIVE
High risk HPV: NEGATIVE

## 2021-07-17 DIAGNOSIS — K648 Other hemorrhoids: Secondary | ICD-10-CM | POA: Insufficient documentation

## 2021-07-25 ENCOUNTER — Other Ambulatory Visit: Payer: Self-pay

## 2021-07-25 ENCOUNTER — Ambulatory Visit (INDEPENDENT_AMBULATORY_CARE_PROVIDER_SITE_OTHER): Payer: BC Managed Care – PPO | Admitting: Cardiology

## 2021-07-25 ENCOUNTER — Encounter: Payer: Self-pay | Admitting: Cardiology

## 2021-07-25 VITALS — BP 140/80 | HR 79 | Ht 61.5 in | Wt 121.6 lb

## 2021-07-25 DIAGNOSIS — Z01810 Encounter for preprocedural cardiovascular examination: Secondary | ICD-10-CM | POA: Insufficient documentation

## 2021-07-25 DIAGNOSIS — E785 Hyperlipidemia, unspecified: Secondary | ICD-10-CM | POA: Diagnosis not present

## 2021-07-25 DIAGNOSIS — Z720 Tobacco use: Secondary | ICD-10-CM

## 2021-07-25 DIAGNOSIS — Z8249 Family history of ischemic heart disease and other diseases of the circulatory system: Secondary | ICD-10-CM | POA: Diagnosis not present

## 2021-07-25 DIAGNOSIS — R0609 Other forms of dyspnea: Secondary | ICD-10-CM

## 2021-07-25 DIAGNOSIS — R931 Abnormal findings on diagnostic imaging of heart and coronary circulation: Secondary | ICD-10-CM | POA: Diagnosis not present

## 2021-07-25 DIAGNOSIS — R0789 Other chest pain: Secondary | ICD-10-CM | POA: Diagnosis not present

## 2021-07-25 DIAGNOSIS — I10 Essential (primary) hypertension: Secondary | ICD-10-CM | POA: Diagnosis not present

## 2021-07-25 DIAGNOSIS — R079 Chest pain, unspecified: Secondary | ICD-10-CM

## 2021-07-25 DIAGNOSIS — K648 Other hemorrhoids: Secondary | ICD-10-CM

## 2021-07-25 DIAGNOSIS — E7800 Pure hypercholesterolemia, unspecified: Secondary | ICD-10-CM

## 2021-07-25 DIAGNOSIS — R0600 Dyspnea, unspecified: Secondary | ICD-10-CM

## 2021-07-25 DIAGNOSIS — I2510 Atherosclerotic heart disease of native coronary artery without angina pectoris: Secondary | ICD-10-CM | POA: Insufficient documentation

## 2021-07-25 MED ORDER — METOPROLOL TARTRATE 50 MG PO TABS: 100.0000 mg | ORAL_TABLET | Freq: Once | ORAL | 0 refills | Status: DC

## 2021-07-25 MED ORDER — ASPIRIN EC 81 MG PO TBEC: 81.0000 mg | DELAYED_RELEASE_TABLET | Freq: Every day | ORAL | 3 refills | Status: AC

## 2021-07-25 MED ORDER — ROSUVASTATIN CALCIUM 20 MG PO TABS: 20.0000 mg | ORAL_TABLET | Freq: Every day | ORAL | 3 refills | Status: DC

## 2021-07-25 NOTE — Progress Notes (Signed)
Primary Care Provider: Mila PalmerWolters, Sharon, MD Cardiologist: None Electrophysiologist: None  Clinic Note: Chief Complaint  Patient presents with   New Patient (Initial Visit)    Elevated coronary calcium score, exertional dyspnea, preop   Coronary Artery Disease    Coronary calcium score elevated     ===================================  ASSESSMENT/PLAN   Problem List Items Addressed This Visit       Cardiology Problems   Hyperlipidemia with target LDL less than 70 (Chronic)    Very poorly controlled lipids.  I do not think we have a choice but to initiate therapy.  With an LDL at this level, I do not think that diet and exercise will be sufficient in managing.  Plan: Start Crestor 20 mg daily, and if tolerated, anticipate increasing dose.  Low threshold to consider additional therapies including PCSK9 inhibitor or potentially inclisiran      Relevant Medications   aspirin EC 81 MG tablet   rosuvastatin (CRESTOR) 20 MG tablet   Agatston coronary artery calcium score between 200 and 399 (Chronic)    Coronary Calcium Score of roughly 350 is actually not unexpected in a patient with poorly controlled lipids and long-term smoking with a family history.  Unfortunately, she now is also noting exertional dyspnea with some chest tightness.  With her desire to start exercising I do think evaluation of For ischemia is warranted in order for her to feel safe exercising, but also as part of preop evaluation for potential hemorrhoid surgery.  Based on the symptoms, I we decided to proceed with a coronary CT angiogram which allows for both anatomic and physiologic data.  With the possibility of not having obstructive disease, it would be nice to know if she does have some potentially Concerning lesions that would not be identified with a nonischemic Myoview.    Plan: Coronary CT angiogram with possible CT FFR Provide metoprolol to take prior to CT scan.  She is on beta-blocker, but heart  rate still not adequately controlled for CT. Start 81 mg aspirin Start Crestor 20 mg daily      Relevant Medications   aspirin EC 81 MG tablet   rosuvastatin (CRESTOR) 20 MG tablet   Other Relevant Orders   EKG 12-Lead   Basic metabolic panel   CT CORONARY MORPH W/CTA COR W/SCORE W/CA W/CM &/OR WO/CM   Essential hypertension - Primary (Chronic)    She tells me that this current pressure for her is "normal if not a little low.  She has had lots of intolerance to medications.  I want to see what her cardiac CT angiogram looks like, but inspected be probably want to maybe retry a different ARB.  There is lip numbness noted for losartan, but no true sign of this being concerning for angioedema.  Plan: For now continue amlodipine (despite the fact that he has listed side effect of swelling), and Bystolic which seems to be tolerated well.      Relevant Medications   aspirin EC 81 MG tablet   rosuvastatin (CRESTOR) 20 MG tablet   Hemorrhoid prolapse    She has been referred to general surgery as well as her OB/GYN to consider the possibility of surgical repair.      Relevant Medications   aspirin EC 81 MG tablet   rosuvastatin (CRESTOR) 20 MG tablet     Other   Chest pain, atypical    I think some of the chest pain may very well could be anginal in nature based on the  fact that it is somewhat exertional and associated with flushing and dyspnea with exertion.  Plan: We will start with anatomic/ischemic evaluation with coronary CTA.  Ischemic evaluation will be CT FFR if needed)      Relevant Orders   EKG 12-Lead   Basic metabolic panel   CT CORONARY MORPH W/CTA COR W/SCORE W/CA W/CM &/OR WO/CM   DOE (dyspnea on exertion)    Pretty significant exertional dyspnea which is currently multifactorial with deconditioning and COPD history of culprits, but now with elevated coronary calcium score and a sense of tightness in her chest, we need to exclude ischemia.  In order to get a full  anatomic evaluation of CAD, will order coronary CT angiogram which then allows for CT FFR if warranted.      Family history of early CAD    Confirmed that she also has coronary disease based on coronary calcium score.  We are going to further stratify with coronary CT angiogram which allows [place] to evaluate both anatomy and physiology.  Starting aspirin 81 mg along with Crestor 20 mg daily.    Pending reassessment of blood pressure, anticipate possibly rechallenge again with valsartan and or olmesartan      Relevant Orders   EKG 12-Lead   Basic metabolic panel   CT CORONARY MORPH W/CTA COR W/SCORE W/CA W/CM &/OR WO/CM   Tobacco abuse   Preop cardiovascular exam    She has been referred to general surgery as well as her OB/GYN to consider the possibility of surgical repair.  With concern of elevated coronary calcium score and exertional dyspnea with some mild tightness in the chest, she does need ischemic evaluation as part of preop evaluation based on my recent interaction with a similar patient.      Relevant Orders   EKG 12-Lead   Basic metabolic panel   CT CORONARY MORPH W/CTA COR W/SCORE W/CA W/CM &/OR WO/CM   Other Visit Diagnoses     Chest pain, unspecified type       Relevant Orders   CT CORONARY MORPH W/CTA COR W/SCORE W/CA W/CM &/OR WO/CM       ===================================  HPI:    Jeanne Harris is a 55 y.o. female with a PMH notable for Long-Term Tobacco Use-with COPD, Hypertension, and SIGNIFICANT HYPERLIPIDEMIA as well as FAMILY HISTORY of PREMATURE CAD who is being seen today for the evaluation of ELEVATED CORONARY CALCIUM SCORE and DYSPNEA ON EXERTION at the request of Mila Palmer, MD.  Jeanne Harris was on 16 August by Dr. Paulino Rily for periodic wellness exam.  She admitted to being quite sedentary since starting to work at home and to be getting COVID.  Was interested in starting walking on her new treadmill for exercise, but was concerned because of  exertional dyspnea.  She also is hoping to potentially have prolapse bleeding hemorrhoid treated by her OB/GYN or general surgery.  For risk stratification, she was sent for coronary calcium score (reviewed below) that led to referral for cardiology consultation.  Recent Hospitalizations: None  Reviewed  CV studies:    The following studies were reviewed today: (if available, images/films reviewed: From Epic Chart or Care Everywhere) Coronary Calcium (Agatston) Score: 358, left main 78, LAD 15, RCA 254 and LCx 10.1.  Also noted aortic atherosclerosis  If CAC is >=100 or >=75th percentile, it is reasonable to initiate statin therapy at any age.  Cardiology referral should be considered for patients with CAC scores >=400 or >=75th percentile.  Treadmill  Stress Echo December 2013: EKG negative for ischemia (reached 93% maximum heart rate).  Echo negative for ischemia.  Baseline echo with normal EF and normal wall motion.  Interval History:   Jeanne Harris presents here today referred by Dr. Paulino Rily, and expedited by telephone call from her friend Loney Hering, RN.  She preferred to be seen by [facility], but was going to have to wait until the end of October or November to be seen.  Katura tells me that she was previously very active, exercise routinely including going on hikes and long walks routinely.  However since the onset of COVID, she has started working from home, and is simply beginning very sedentary.  Now she hardly does anything.  She has noted now that she gets exertional dyspnea with really not doing much.  She is attributed this shortness of breath to smoking-related COPD, but notes now that she feels a little bit of tightness in her chest when she cannot catch her breath and she feels flushed and lightheaded.  She has to stop to catch her breath.  She does not have any of the symptoms at rest.  With this shortness of breath being associated with some tightness in her chest, she is very  concerned based on her family history with a father having MI at 66.  She is also a very heavy smoker and her recent cholesterol panel was very poorly controlled.  She tells me that she has had longstanding hypertension and has intolerance of several medications.  The only when she really tell me about was coughing with lisinopril.  (Reviewing of her "allergy list "has extensive list).  She has been on Bystolic for some time now, and seems to be tolerating the amlodipine despite it being listed as an allergy.  She has occasional irregular heartbeat/skipped beats but nothing prolonged.  No prolonged heart rate abnormalities.  CV Review of Symptoms (Summary) Cardiovascular ROS: positive for - chest pain, dyspnea on exertion, irregular heartbeat, palpitations, shortness of breath, and some flushing and dizziness associated with exertion negative for - edema, orthopnea, paroxysmal nocturnal dyspnea, rapid heart rate, or syncope/near syncope or TIA/amaurosis fugax, claudication  REVIEWED OF SYSTEMS   Review of Systems  Constitutional:  Positive for malaise/fatigue (Not sure if this is just from being lazy/sedentary or true fatigue.). Negative for chills, fever and weight loss.  HENT:  Negative for congestion and nosebleeds (Rare).   Respiratory:  Positive for cough (Nonproductive), shortness of breath (Somewhat baseline) and wheezing.   Cardiovascular:  Negative for chest pain, palpitations, claudication and leg swelling.       Per HPI  Gastrointestinal:  Positive for blood in stool (Hemorrhoidal) and constipation. Negative for melena.       Significantly painful prolapsed/inflamed hemorrhoid  Genitourinary:  Negative for hematuria.  Musculoskeletal:  Negative for back pain, falls and myalgias.  Neurological:  Negative for dizziness and focal weakness.  Endo/Heme/Allergies:  Negative for environmental allergies. Does not bruise/bleed easily.  Psychiatric/Behavioral:  Negative for depression and  memory loss. The patient is nervous/anxious. The patient does not have insomnia.    I have reviewed and (if needed) personally updated the patient's problem list, medications, allergies, past medical and surgical history, social and family history.   PAST MEDICAL HISTORY   Past Medical History:  Diagnosis Date   Alcohol abuse, daily use    Chest pain    Chronic bronchitis    COPD (chronic obstructive pulmonary disease) (HCC)    pt states diagnosed by  Dr. Kevan Ny   Diverticulitis    History of hypertension    Hyperlipidemia with target LDL less than 70 12/15/2011   Prolapsed hemorrhoids    SOB (shortness of breath)    when having chest pain   Tobacco abuse     PAST SURGICAL HISTORY   Past Surgical History:  Procedure Laterality Date   none      Immunization History  Administered Date(s) Administered   Moderna Sars-Covid-2 Vaccination 01/25/2020, 02/22/2020, 10/11/2020    MEDICATIONS/ALLERGIES   Current Meds  Medication Sig   amLODipine (NORVASC) 10 MG tablet Take 10 mg by mouth every morning.   BYSTOLIC 20 MG TABS every evening.   Ibuprofen (ADVIL PO) Take by mouth as needed.    Allergies  Allergen Reactions   Hydrochlorothiazide     Dizziness   Lisinopril Cough   Norvasc [Amlodipine] Other (See Comments)    Edema   Other     Nitrous Oxide   Oyster Shell Nausea Only    SOCIAL HISTORY/FAMILY HISTORY   Reviewed in Epic:  Pertinent findings:  Social History   Tobacco Use   Smoking status: Every Day    Packs/day: 2.00    Years: 37.00    Pack years: 74.00    Types: Cigarettes   Smokeless tobacco: Never  Vaping Use   Vaping Use: Former  Substance Use Topics   Alcohol use: Yes    Alcohol/week: 20.0 standard drinks    Types: 20 Cans of beer per week   Drug use: Yes    Types: Marijuana    Comment: Occasional marijuana use   Social History   Social History Narrative   Married.  Lives with [place].  They have no kids.   Long-term smoker 2 packs a  day for 37 years and drinks 20 beers a week.   She is close personal friends with Loney Hering, RN (formerly from [employer]), and Luna Kitchens also from [facility]      Had previously been very active, but since she started working from home has become more more sedentary.  Now is essentially a "couch potato".      She purchased a treadmill, and would like to start working on it, but is concerned now about her coronary calcium score.   Family History  Problem Relation Age of Onset   Cancer Mother        Breast Cancer   Hypertension Mother    Breast cancer Mother    Stroke Mother    Heart failure Mother        Never diagnosed with CAD   Heart attack Father 59       X4   Heart disease Father    Emphysema Father    Cancer Father        Prostate Cancer   Pulmonary disease Father    Hypertension Sister    Hypertension Sister    Thyroid disease Sister    Hypertension Brother    Cancer Maternal Grandmother        Colon    Cancer Maternal Aunt        breast cancer   Parkinson's disease Maternal Aunt    Breast cancer Maternal Aunt    Cancer Maternal Aunt        breast cancer   Breast cancer Maternal Aunt    Cancer Maternal Aunt        breast cancer   Cancer Maternal Aunt  breast cancer    Cancer Cousin        breast cancer     OBJCTIVE -PE, EKG, labs   Wt Readings from Last 3 Encounters:  07/25/21 121 lb 9.6 oz (55.2 kg)  07/14/21 121 lb 9.6 oz (55.2 kg)  05/24/17 128 lb 1.1 oz (58.1 kg)    Physical Exam: BP 140/80 (BP Location: Right Arm)   Pulse 79   Ht 5' 1.5" (1.562 m)   Wt 121 lb 9.6 oz (55.2 kg)   LMP 07/03/2013   SpO2 98%   BMI 22.60 kg/m  Physical Exam Vitals reviewed.  Constitutional:      General: She is not in acute distress.    Appearance: Normal appearance. She is normal weight. She is not toxic-appearing.     Comments: Well-nourished, well-groomed.  Healthy-appearing.  HENT:     Head: Normocephalic and atraumatic.   Neck:     Vascular: No carotid bruit or JVD.  Cardiovascular:     Rate and Rhythm: Normal rate and regular rhythm. Occasional Extrasystoles are present.    Chest Wall: PMI is not displaced.     Pulses: Normal pulses.     Heart sounds: S1 normal and S2 normal. No murmur heard.   No friction rub. No gallop.  Pulmonary:     Effort: Pulmonary effort is normal. No respiratory distress.     Breath sounds: Wheezing (Faint expiratory wheeze) and rhonchi (Mild coarse diffuse) present. No rales.  Chest:     Chest wall: No tenderness.  Abdominal:     General: Abdomen is flat. Bowel sounds are normal. There is no distension.     Palpations: Abdomen is soft. There is no mass.     Tenderness: There is no abdominal tenderness.  Musculoskeletal:     Cervical back: Normal range of motion and neck supple.  Neurological:     General: No focal deficit present.     Mental Status: She is alert and oriented to person, place, and time.     Cranial Nerves: No cranial nerve deficit.     Motor: No weakness.     Gait: Gait normal.  Psychiatric:        Mood and Affect: Mood normal.        Behavior: Behavior normal.        Thought Content: Thought content normal.        Judgment: Judgment normal.     Comments: Seems quite anxious     Adult ECG Report  Rate: 79 ;  Rhythm: normal sinus rhythm, premature atrial contractions (PAC), and incomplete RBBB. ; Otherwise normal axis, intervals and durations.  Narrative Interpretation: Borderline  Recent Labs: 07/03/2021 Na+ 139, K+ 3.8, Cl- 101, HCO3-29, BUN 6, Cr 0.66, Glu 103, Ca2+ 10.2; 10.2 AST 23, ALT 20, AlkP 66; T Prot8.1, albumin 4.7. CBC: W 6.0, H/H 15.3/45.0, Plt 231 TC 262, TG 94, HDL 76, LDL 170**; TSH 1.27, vitamin D 31.8.   ==================================================  COVID-19 Education: The signs and symptoms of COVID-19 were discussed with the patient and how to seek care for testing (follow up with PCP or arrange E-visit).    I spent a  total of 53 minutes with the patient spent in direct patient consultation.  Additional time spent with chart review  / charting (studies, outside notes, etc): 25 min Total Time: 78 min  Current medicines are reviewed at length with the patient today.  (+/- concerns) reluctant to take new meds.  This visit occurred during  the SARS-CoV-2 public health emergency.  Safety protocols were in place, including screening questions prior to the visit, additional usage of staff PPE, and extensive cleaning of exam room while observing appropriate contact time as indicated for disinfecting solutions.  Notice: This dictation was prepared with Dragon dictation along with smaller phrase technology. Any transcriptional errors that result from this process are unintentional and may not be corrected upon review.  Patient Instructions / Medication Changes & Studies & Tests Ordered   Patient Instructions  Medication Instructions:  See instruction below  One time Metoprolol  tartrate 100 mg prior to CCTA  Start taking Aspirin 81 mg Daily   Rosuvastatin 20 mg  take at bedtime  *If you need a refill on your cardiac medications before your next appointment, please call your pharmacy*   Lab Work: BMP- see instructions  If you have labs (blood work) drawn today and your tests are completely normal, you will receive your results only by: MyChart Message (if you have MyChart) OR A paper copy in the mail If you have any lab test that is abnormal or we need to change your treatment, we will call you to review the results.   Testing/Procedures: Will be schedule after insurance authorization is given. [address]  Your physician has requested that you have cardiac CTA. Cardiac computed tomography (CT) is a painless test that uses an x-ray machine to take clear, detailed pictures of your heart. Please follow instruction sheet as given.     Follow-Up: At [facility], you and your health needs are  our priority.  As part of our continuing mission to provide you with exceptional heart care, we have created designated Provider Care Teams.  These Care Teams include your primary Cardiologist (physician) and Advanced Practice Providers (APPs -  Physician Assistants and Nurse Practitioners) who all work together to provide you with the care you need, when you need it.     Your next appointment:   2 month(s)  The format for your next appointment:   In Person  Provider:   Bryan Lemma, MD   Other Instructions -> preparation for CT scan instructions.  Studies Ordered:   Orders Placed This Encounter  Procedures   CT CORONARY MORPH W/CTA COR W/SCORE W/CA W/CM &/OR WO/CM   Basic metabolic panel   EKG 12-Lead      Bryan Lemma, M.D., M.S. Interventional Cardiologist   Pager # [phone] Phone # [phone] [address] [place], [place] [address]   Thank you for choosing Heartcare at [facility]!!

## 2021-07-25 NOTE — Patient Instructions (Addendum)
Medication Instructions:  See instruction below  One time Metoprolol  tartrate 100 mg prior to CCTA  Start taking Aspirin 81 mg Daily   Rosuvastatin 20 mg  take at bedtime  *If you need a refill on your cardiac medications before your next appointment, please call your pharmacy*   Lab Work: BMP- see instructions  If you have labs (blood work) drawn today and your tests are completely normal, you will receive your results only by: MyChart Message (if you have MyChart) OR A paper copy in the mail If you have any lab test that is abnormal or we need to change your treatment, we will call you to review the results.   Testing/Procedures: Will be schedule after insurance authorization is given. [address]  Your physician has requested that you have cardiac CTA. Cardiac computed tomography (CT) is a painless test that uses an x-ray machine to take clear, detailed pictures of your heart. Please follow instruction sheet as given.     Follow-Up: At [facility], you and your health needs are our priority.  As part of our continuing mission to provide you with exceptional heart care, we have created designated Provider Care Teams.  These Care Teams include your primary Cardiologist (physician) and Advanced Practice Providers (APPs -  Physician Assistants and Nurse Practitioners) who all work together to provide you with the care you need, when you need it.     Your next appointment:   2 month(s)  The format for your next appointment:   In Person  Provider:   Bryan Lemma, MD   Other Instructions    Your cardiac CT will be scheduled at the below locations:   [facility] [address] [place], [place] [address] [phone]     Please arrive at the [facility] main entrance (entrance A) of [facility] 30 minutes prior to test start time. Proceed to the [facility] Radiology Department (first floor) to check-in and test prep.    Please  follow these instructions carefully (unless otherwise directed):  Please have  LAB__ ()done one week prior to test   On the Night Before the Test: Be sure to Drink plenty of water. Do not consume any caffeinated/decaffeinated beverages or chocolate 12 hours prior to your test. Do not take any antihistamines 12 hours prior to your test.   On the Day of the Test: Drink plenty of water until 1 hour prior to the test. Do not eat any food 4 hours prior to the test. You may take your regular medications prior to the test.  Take metoprolol (Lopressor) 100 mg  two hours prior to test., take your regular Bystolic at night FEMALES- please wear underwire-free bra if available, avoid dresses & tight clothing          After the Test: Drink plenty of water. After receiving IV contrast, you may experience a mild flushed feeling. This is normal. On occasion, you may experience a mild rash up to 24 hours after the test. This is not dangerous. If this occurs, you can take Benadryl 25 mg and increase your fluid intake. If you experience trouble breathing, this can be serious. If it is severe call 911 IMMEDIATELY. If it is mild, please call our office.   Please allow 2-4 weeks for scheduling of routine cardiac CTs. Some insurance companies require a pre-authorization which may delay scheduling of this test.   For non-scheduling related questions, please contact the cardiac imaging nurse navigator  should you have any questions/concerns: Rockwell Alexandria, Cardiac Imaging Nurse Navigator Larey Brick, Cardiac Imaging Nurse Navigator [facility] Direct Office Dial: [phone]   For scheduling needs, including cancellations and rescheduling, please call [place], [phone].

## 2021-07-27 ENCOUNTER — Encounter: Payer: Self-pay | Admitting: Cardiology

## 2021-07-27 NOTE — Assessment & Plan Note (Signed)
She has been referred to general surgery as well as her OB/GYN to consider the possibility of surgical repair.  With concern of elevated coronary calcium score and exertional dyspnea with some mild tightness in the chest, she does need ischemic evaluation as part of preop evaluation based on my recent interaction with a similar patient.

## 2021-07-27 NOTE — Assessment & Plan Note (Addendum)
She has been referred to general surgery as well as her OB/GYN to consider the possibility of surgical repair.

## 2021-07-27 NOTE — Assessment & Plan Note (Signed)
Confirmed that she also has coronary disease based on coronary calcium score.  We are going to further stratify with coronary CT angiogram which allows [place] to evaluate both anatomy and physiology.  Starting aspirin 81 mg along with Crestor 20 mg daily.    Pending reassessment of blood pressure, anticipate possibly rechallenge again with valsartan and or olmesartan

## 2021-07-27 NOTE — Assessment & Plan Note (Signed)
Very poorly controlled lipids.  I do not think we have a choice but to initiate therapy.  With an LDL at this level, I do not think that diet and exercise will be sufficient in managing.  Plan: Start Crestor 20 mg daily, and if tolerated, anticipate increasing dose.  Low threshold to consider additional therapies including PCSK9 inhibitor or potentially inclisiran

## 2021-07-27 NOTE — Assessment & Plan Note (Signed)
She tells me that this current pressure for her is "normal if not a little low.  She has had lots of intolerance to medications.  I want to see what her cardiac CT angiogram looks like, but inspected be probably want to maybe retry a different ARB.  There is lip numbness noted for losartan, but no true sign of this being concerning for angioedema.  Plan: For now continue amlodipine (despite the fact that he has listed side effect of swelling), and Bystolic which seems to be tolerated well.

## 2021-07-27 NOTE — Assessment & Plan Note (Signed)
Coronary Calcium Score of roughly 350 is actually not unexpected in a patient with poorly controlled lipids and long-term smoking with a family history.  Unfortunately, she now is also noting exertional dyspnea with some chest tightness.  With her desire to start exercising I do think evaluation of For ischemia is warranted in order for her to feel safe exercising, but also as part of preop evaluation for potential hemorrhoid surgery.  Based on the symptoms, I we decided to proceed with a coronary CT angiogram which allows for both anatomic and physiologic data.  With the possibility of not having obstructive disease, it would be nice to know if she does have some potentially Concerning lesions that would not be identified with a nonischemic Myoview.    Plan:  Coronary CT angiogram with possible CT FFR  Provide metoprolol to take prior to CT scan.  She is on beta-blocker, but heart rate still not adequately controlled for CT.  Start 81 mg aspirin  Start Crestor 20 mg daily

## 2021-07-27 NOTE — Assessment & Plan Note (Signed)
I think some of the chest pain may very well could be anginal in nature based on the fact that it is somewhat exertional and associated with flushing and dyspnea with exertion.  Plan: We will start with anatomic/ischemic evaluation with coronary CTA.  Ischemic evaluation will be CT FFR if needed)

## 2021-07-27 NOTE — Assessment & Plan Note (Signed)
Pretty significant exertional dyspnea which is currently multifactorial with deconditioning and COPD history of culprits, but now with elevated coronary calcium score and a sense of tightness in her chest, we need to exclude ischemia.  In order to get a full anatomic evaluation of CAD, will order coronary CT angiogram which then allows for CT FFR if warranted.

## 2021-07-30 ENCOUNTER — Ambulatory Visit: Payer: Self-pay | Admitting: Cardiology

## 2021-07-30 ENCOUNTER — Ambulatory Visit (HOSPITAL_BASED_OUTPATIENT_CLINIC_OR_DEPARTMENT_OTHER): Payer: BC Managed Care – PPO | Admitting: Obstetrics & Gynecology

## 2021-07-31 DIAGNOSIS — R931 Abnormal findings on diagnostic imaging of heart and coronary circulation: Secondary | ICD-10-CM | POA: Diagnosis not present

## 2021-07-31 DIAGNOSIS — Z01810 Encounter for preprocedural cardiovascular examination: Secondary | ICD-10-CM | POA: Diagnosis not present

## 2021-07-31 DIAGNOSIS — Z1211 Encounter for screening for malignant neoplasm of colon: Secondary | ICD-10-CM | POA: Diagnosis not present

## 2021-07-31 DIAGNOSIS — Z8249 Family history of ischemic heart disease and other diseases of the circulatory system: Secondary | ICD-10-CM | POA: Diagnosis not present

## 2021-07-31 DIAGNOSIS — R0789 Other chest pain: Secondary | ICD-10-CM | POA: Diagnosis not present

## 2021-08-01 ENCOUNTER — Telehealth (HOSPITAL_COMMUNITY): Payer: Self-pay | Admitting: *Deleted

## 2021-08-01 LAB — BASIC METABOLIC PANEL
BUN/Creatinine Ratio: 11 (ref 9–23)
BUN: 7 mg/dL (ref 6–24)
CO2: 21 mmol/L (ref 20–29)
Calcium: 10.5 mg/dL — ABNORMAL HIGH (ref 8.7–10.2)
Chloride: 100 mmol/L (ref 96–106)
Creatinine, Ser: 0.61 mg/dL (ref 0.57–1.00)
Glucose: 108 mg/dL — ABNORMAL HIGH (ref 65–99)
Potassium: 4.4 mmol/L (ref 3.5–5.2)
Sodium: 140 mmol/L (ref 134–144)
eGFR: 106 mL/min/{1.73_m2} (ref >59)

## 2021-08-01 NOTE — Telephone Encounter (Signed)
Attempted to call patient regarding upcoming cardiac CT appointment. °Left message on voicemail with name and callback number ° °Milind Raether RN Navigator Cardiac Imaging °[facility] °[phone] Office °[phone] Cell ° °

## 2021-08-04 ENCOUNTER — Telehealth (HOSPITAL_COMMUNITY): Payer: Self-pay | Admitting: *Deleted

## 2021-08-04 NOTE — Telephone Encounter (Signed)
Patient returning call regarding regarding upcoming cardiac imaging study; pt verbalizes understanding of appt date/time, parking situation and where to check in, pre-test NPO status and medications ordered, and verified current allergies; name and call back number provided for further questions should they arise  Larey Brick RN Navigator Cardiac Imaging [facility] [phone] office [phone] cell  Patient to take 50mg  metoprolol tartrate two hours prior to cardiac CT scan.

## 2021-08-05 ENCOUNTER — Ambulatory Visit (HOSPITAL_COMMUNITY)
Admission: RE | Admit: 2021-08-05 | Discharge: 2021-08-05 | Disposition: A | Discharge status: Home / Self Care | Payer: BC Managed Care – PPO | Source: Ambulatory Visit | Attending: Cardiology | Admitting: Cardiology

## 2021-08-05 ENCOUNTER — Other Ambulatory Visit: Payer: Self-pay

## 2021-08-05 ENCOUNTER — Other Ambulatory Visit (HOSPITAL_BASED_OUTPATIENT_CLINIC_OR_DEPARTMENT_OTHER): Payer: Self-pay | Admitting: Cardiology

## 2021-08-05 DIAGNOSIS — R079 Chest pain, unspecified: Secondary | ICD-10-CM | POA: Insufficient documentation

## 2021-08-05 DIAGNOSIS — Z8249 Family history of ischemic heart disease and other diseases of the circulatory system: Secondary | ICD-10-CM | POA: Diagnosis not present

## 2021-08-05 DIAGNOSIS — R0789 Other chest pain: Secondary | ICD-10-CM

## 2021-08-05 DIAGNOSIS — R072 Precordial pain: Secondary | ICD-10-CM

## 2021-08-05 DIAGNOSIS — I2510 Atherosclerotic heart disease of native coronary artery without angina pectoris: Secondary | ICD-10-CM | POA: Diagnosis not present

## 2021-08-05 DIAGNOSIS — Z01810 Encounter for preprocedural cardiovascular examination: Secondary | ICD-10-CM | POA: Insufficient documentation

## 2021-08-05 DIAGNOSIS — R931 Abnormal findings on diagnostic imaging of heart and coronary circulation: Secondary | ICD-10-CM

## 2021-08-05 HISTORY — DX: Atherosclerotic heart disease of native coronary artery without angina pectoris: I25.10

## 2021-08-05 HISTORY — PX: OTHER SURGICAL HISTORY: SHX169

## 2021-08-05 MED ORDER — NITROGLYCERIN 0.4 MG SL SUBL
SUBLINGUAL_TABLET | SUBLINGUAL | Status: AC
  Filled 2021-08-05: qty 2

## 2021-08-05 MED ORDER — METOPROLOL TARTRATE 5 MG/5ML IV SOLN: 10.0000 mg | INTRAVENOUS | Status: DC | PRN

## 2021-08-05 MED ORDER — NITROGLYCERIN 0.4 MG SL SUBL
0.4000 mg | SUBLINGUAL_TABLET | SUBLINGUAL | Status: DC | PRN
  Administered 2021-08-05: 0.8 mg via SUBLINGUAL

## 2021-08-05 MED ORDER — METOPROLOL TARTRATE 5 MG/5ML IV SOLN
INTRAVENOUS | Status: AC
  Administered 2021-08-05: 10 mg via INTRAVENOUS
  Filled 2021-08-05: qty 20

## 2021-08-05 MED ORDER — IOHEXOL 350 MG/ML SOLN
95.0000 mL | Freq: Once | INTRAVENOUS | Status: AC | PRN
  Administered 2021-08-05: 95 mL via INTRAVENOUS

## 2021-08-05 NOTE — Progress Notes (Signed)
I don't think anything other than CT would show the plaque itself. It wasn't significant by FFR, which is not surprising as it didn't look occlusive. I don't think something like a treadmill or nuclear would help [place]. I think the options would be very aggressive lipid management vs. Cath for further characterization. Option may still be aggressive meds, even with cath, but could get more info with ivus if the locations are amenable.

## 2021-08-06 ENCOUNTER — Telehealth: Payer: Self-pay | Admitting: *Deleted

## 2021-08-06 LAB — COLOGUARD: Cologuard: POSITIVE — AB

## 2021-08-06 NOTE — Telephone Encounter (Signed)
Called patient left message for patient to return call -  Dr Herbie [place] would like to discuss result in detail  Appointment on hold for Sept 29. 2023 at 2:30 pm  Patient will need to call back to verify if she can make the time slot

## 2021-08-06 NOTE — Progress Notes (Signed)
Thanks. I think it is reassuring the fact that it was not positive by FFR. dh

## 2021-08-06 NOTE — Telephone Encounter (Signed)
Patient reviewed  result via mychart ?

## 2021-08-06 NOTE — Telephone Encounter (Signed)
-----   Message from Marykay Lex, MD sent at 08/05/2021  9:43 PM EDT ----- Coronary CTA Results: - Calcium Score ~310. Left Main - protruding, non-calcified plaque at the ostium (opening) of the Left Main (no evidence of dissection). !! LAD & LCx - ~ proximal minimal 1-24% plaque. Prox RCA 50-70% plaque (mixed). Mid-distal RCA ~50-70%.  --> Send for CT FFR - did not suggest that the RCA stenoses are physiologically significant.  However, in light of the Left Main findings - we need to discuss other imaging modalities (including cardiac catheterization with intravascular imaging called IVUS).  --> if this is significant - will need to determine best treatment.  If NOT significant , then we are back to aggressive risk factor treatment.   Bryan Lemma, MD

## 2021-08-08 NOTE — Addendum Note (Signed)
Addended by: Jerene Bears on: 08/08/2021 10:15 AM   Modules accepted: Orders

## 2021-08-14 ENCOUNTER — Other Ambulatory Visit: Payer: Self-pay

## 2021-08-14 ENCOUNTER — Ambulatory Visit (INDEPENDENT_AMBULATORY_CARE_PROVIDER_SITE_OTHER): Payer: BC Managed Care – PPO | Admitting: Cardiology

## 2021-08-14 ENCOUNTER — Encounter: Payer: Self-pay | Admitting: Cardiology

## 2021-08-14 VITALS — BP 136/88 | HR 80 | Ht 61.5 in | Wt 120.8 lb

## 2021-08-14 DIAGNOSIS — R931 Abnormal findings on diagnostic imaging of heart and coronary circulation: Secondary | ICD-10-CM | POA: Diagnosis not present

## 2021-08-14 DIAGNOSIS — I10 Essential (primary) hypertension: Secondary | ICD-10-CM

## 2021-08-14 DIAGNOSIS — Z01810 Encounter for preprocedural cardiovascular examination: Secondary | ICD-10-CM | POA: Diagnosis not present

## 2021-08-14 DIAGNOSIS — R0609 Other forms of dyspnea: Secondary | ICD-10-CM

## 2021-08-14 DIAGNOSIS — I2510 Atherosclerotic heart disease of native coronary artery without angina pectoris: Secondary | ICD-10-CM

## 2021-08-14 DIAGNOSIS — E785 Hyperlipidemia, unspecified: Secondary | ICD-10-CM

## 2021-08-14 DIAGNOSIS — R0600 Dyspnea, unspecified: Secondary | ICD-10-CM

## 2021-08-14 DIAGNOSIS — K648 Other hemorrhoids: Secondary | ICD-10-CM

## 2021-08-14 DIAGNOSIS — R0789 Other chest pain: Secondary | ICD-10-CM

## 2021-08-14 DIAGNOSIS — Z720 Tobacco use: Secondary | ICD-10-CM

## 2021-08-14 MED ORDER — VALSARTAN 160 MG PO TABS: 160.0000 mg | ORAL_TABLET | Freq: Every day | ORAL | 3 refills | Status: DC

## 2021-08-14 NOTE — Assessment & Plan Note (Signed)
Based on results of CT FFR, I do not think that macrovascular ischemia is the reason for her exertional dyspnea.  I think Likely related to deconditioning.  Would be fine for her to get back into doing exercise.  If in the future would evaluate this further, we can consider CPX to determine response to exercise.

## 2021-08-14 NOTE — Assessment & Plan Note (Signed)
Lipid panel was very poor on last check.  We had started her on 20 mg of Crestor.  She seems to be tolerating it relatively TIG well.  We should recheck lipids when I see her back in November.  If not getting closer to goal, would have low threshold to titrate up further and consider PCSK9 inhibitor which would require referral to lipid clinic.

## 2021-08-14 NOTE — Assessment & Plan Note (Signed)
Based on nonischemic CT FFR, I do not think that the chest discomfort that she was feeling during the last visit would be at all related to coronary disease.  It is possible that she has spasm but she is already on high-dose amlodipine.  I think at this point would simply continue aspirin beta-blocker and calcium channel blocker albeit may be a reduced dose.  We are adding additional BP control with ARB.

## 2021-08-14 NOTE — Assessment & Plan Note (Addendum)
I was contacted by the reading physician to discuss the findings on the RCA and LM prior to CT FFR being done.  Thankfully, CT FFR was negative.  The LAD and LCx seem to be normal.  RCA seen to have more disease, but not flow-limiting.  The LEFT MAIN lesion did not have a dangerous appearance of dissection, and if there is indeed a plaque in that area, cardiac catheterization may not be the best option for fear of disrupting the plaque.  As such, plan is to aggressively manage medically, unless symptoms warrant..  She seems to be tolerating rosuvastatin, and I do anticipate increasing the dose if she seems to do well after the couple months.   Plan:   Continue current dose of Bystolic along with aspirin 81 mg daily.  Continue amlodipine (although she would potentially like to wean off of this because of gum swelling)  For now continue current dose of rosuvastatin, anticipate increasing further depending on follow-up labs.  Smoke cessation counseling provided.  She says that she " wants to want to quit" --> she is discussing cessation plans with various friends sick over the past course of action.  Does not necessarily want take medications for it.  I encouraged her to get back into exercise plan, but to take it easy so does not overdo it.  She should monitor her for any untoward symptoms but otherwise should feel comfortable based on negative FFR CT.

## 2021-08-14 NOTE — Assessment & Plan Note (Signed)
Very painful.  She has been referred to Dr. Romie Levee.  Is due to be seen in October.

## 2021-08-14 NOTE — Patient Instructions (Signed)
Medication Instructions:   Start Valsartan 160 mg   - for the first 6 days  take 1/2 tablet  then on the 7 th day take one whole tablet  at bedtime .   Start taking Amlodipine  in the morning *If you need a refill on your cardiac medications before your next appointment, please call your pharmacy*   Lab Work: Lipid  in Nov 2022  If you have labs (blood work) drawn today and your tests are completely normal, you will receive your results only by: MyChart Message (if you have MyChart) OR A paper copy in the mail If you have any lab test that is abnormal or we need to change your treatment, we will call you to review the results.   Testing/Procedures:  Not  needed   Follow-Up: At [facility], you and your health needs are our priority.  As part of our continuing mission to provide you with exceptional heart care, we have created designated Provider Care Teams.  These Care Teams include your primary Cardiologist (physician) and Advanced Practice Providers (APPs -  Physician Assistants and Nurse Practitioners) who all work together to provide you with the care you need, when you need it.     Your next appointment:   Keep your appointment for Nov 2022  The format for your next appointment:   In Person  Provider:   Bryan Lemma, MD  Other instructions Okay to have surgery from cardiac standpoint  with Dr A. Janee Morn.

## 2021-08-14 NOTE — Progress Notes (Signed)
Primary Care Provider: Mila Palmer, MD Cardiologist: None Electrophysiologist: None  Clinic Note: Chief Complaint  Patient presents with   2 Week Follow-up    Coronary CTA results after FFR. - >  Nonsignificant CT FFR findings   Pre-op Exam    Has pending clinic visit with Dr. Romie Levee for hemorrhoid surgery.     ===================================  ASSESSMENT/PLAN   Problem List Items Addressed This Visit       Cardiology Problems   Hyperlipidemia with target LDL less than 70 - Primary (Chronic)    Lipid panel was very poor on last check.  We had started her on 20 mg of Crestor.  She seems to be tolerating it relatively TIG well.  We should recheck lipids when I see her back in November.  If not getting closer to goal, would have low threshold to titrate up further and consider PCSK9 inhibitor which would require referral to lipid clinic.      Relevant Medications   valsartan (DIOVAN) 160 MG tablet   Other Relevant Orders   Lipid panel   Coronary artery disease, non-occlusive by coronary CTA (CT FFR negative for LM, RCA, LAD and LCx) (Chronic)    I was contacted by the reading physician to discuss the findings on the RCA and LM prior to CT FFR being done.  Thankfully, CT FFR was negative.  The LAD and LCx seem to be normal.  RCA seen to have more disease, but not flow-limiting.  The LEFT MAIN lesion did not have a dangerous appearance of dissection, and if there is indeed a plaque in that area, cardiac catheterization may not be the best option for fear of disrupting the plaque.  As such, plan is to aggressively manage medically, unless symptoms warrant..  She seems to be tolerating rosuvastatin, and I do anticipate increasing the dose if she seems to do well after the couple months.   Plan:  Continue current dose of Bystolic along with aspirin 81 mg daily. Continue amlodipine (although she would potentially like to wean off of this because of gum swelling) For now  continue current dose of rosuvastatin, anticipate increasing further depending on follow-up labs. Smoke cessation counseling provided.  She says that she " wants to want to quit" --> she is discussing cessation plans with various friends sick over the past course of action.  Does not necessarily want take medications for it. I encouraged her to get back into exercise plan, but to take it easy so does not overdo it.  She should monitor her for any untoward symptoms but otherwise should feel comfortable based on negative FFR CT.      Relevant Medications   valsartan (DIOVAN) 160 MG tablet   Essential hypertension (Chronic)    Blood pressure still higher than we would like on amlodipine and Bystolic.  Her dentist did mention something about gum swelling from the amlodipine.  Maybe we could potentially come down the dose little bit and add additional medication.  Plan: Will start moderate dose valsartan 160 mg daily (first week will take 1/2 tablet daily. Continue amlodipine, follow pressures as we titrate up ARB, anticipate potentially backing down on the dose at the dentist request Continue Bystolic.  Unfortunate I do not think we get much more benefit of any higher dose.      Relevant Medications   valsartan (DIOVAN) 160 MG tablet   Hemorrhoid prolapse (Chronic)    Very painful.  She has been referred to Dr. Romie Levee.  Is due to be seen in October.      Relevant Medications   valsartan (DIOVAN) 160 MG tablet     Other   Chest pain, atypical    Based on nonischemic CT FFR, I do not think that the chest discomfort that she was feeling during the last visit would be at all related to coronary disease.  It is possible that she has spasm but she is already on high-dose amlodipine.  I think at this point would simply continue aspirin beta-blocker and calcium channel blocker albeit may be a reduced dose.  We are adding additional BP control with ARB.      DOE (dyspnea on exertion)     Based on results of CT FFR, I do not think that macrovascular ischemia is the reason for her exertional dyspnea.  I think Likely related to deconditioning.  Would be fine for her to get back into doing exercise.  If in the future would evaluate this further, we can consider CPX to determine response to exercise.      Tobacco abuse    Smoking cessation instruction/counseling given:  counseled patient on the dangers of tobacco use, advised patient to stop smoking, and reviewed strategies to maximize success.  We discussed strategies.  Roughly 4-5 minutes spent.       Preop cardiovascular exam    There was concern basely coronary calcium score as far as preop evaluation, I think with negative CT FFR findings, having her on a stable dose of statin and beta-blocker should be cardioprotective enough to proceed with surgery.  With the left main RCA and LM being the areas of concern, both segment CT FFR was nonischemic.  At this point I do not think that invasive evaluation is warranted.  She is on a beta-blocker and statin now.  Would not change.  No further cardiac evaluation required prior to surgery.  Would be considered low risk patient for low risk procedure.      Other Visit Diagnoses     Abnormal findings diagnostic imaging of heart and coronary circulation       Relevant Orders   EKG 12-Lead      ===================================  HPI:    Jeanne Harris is a 55 y.o. female with a PMH notable for Long-Term Tobacco Use-with COPD, Hypertension, and SIGNIFICANT HYPERLIPIDEMIA as well as FAMILY HISTORY of PREMATURE CAD who is being seen today for 2-week follow-up evaluation after CARDIAC CT ANGIOGRAM.   Jeanne Harris was on seen for initial consultation on July 25, 2021 at the request of Dr. Laurine Blazer.  She did gone for coronary calcium scoring which was over 300.  She is relatively sedentary and has some exertional dyspnea with rare intermittent chest discomfort symptoms but not  associate with activity.  She thought that a lot of her dyspnea was related to deconditioning, but with potential upcoming surgery, we chose to proceed with noninvasive evaluation with Coronary/Cardiac CTA.  She now presents to discuss the results.  Recent Hospitalizations: None  Reviewed  CV studies:    The following studies were reviewed today: (if available, images/films reviewed: From Epic Chart or Care Everywhere)  Coronary CTA 08/05/2021: LM: Focal protruding noncalcified plaque in the ost LM-no extravasation to suggest dissection (FFR 0.99).  Cannot r/o ulcerated plaque.  Nl caliber LAD w/ mixed cal/noncal plaque (1-24% prox).  LCx-large OM1 = prox LCx (1-24%).;  Prox RCA 50 to 69% mixed cal/noncal plaque.  Focal mixed 50-69% mid to dis RCA (high risk  features).  Diffuse mixed 50-69 % dRCA => (FFR 0.98-0.93-.09) -- NOT SIGNIFICANT;   Interval History:   Jeanne Harris presents here today to discuss results of her test, because of the difficulty in interpreting the results.  referred by Dr. Paulino Rily, and expedited by telephone call from her friend Loney Hering, RN.  She preferred to be seen by [facility], but was going to have to wait until the end of October or November to be seen.  Jeanne Harris tells me that she was previously very active, exercise routinely including going on hikes and long walks routinely.  However since the onset of COVID, she has started working from home, and is simply beginning very sedentary.  Now she hardly does anything.  She has noted now that she gets exertional dyspnea with really not doing much.  She is attributed this shortness of breath to smoking-related COPD, but notes now that she feels a little bit of tightness in her chest when she cannot catch her breath and she feels flushed and lightheaded.  She has to stop to catch her breath.  She does not have any of the symptoms at rest.  With this shortness of breath being associated with some tightness in her chest, she is  very concerned based on her family history with a father having MI at 31.  She is also a very heavy smoker and her recent cholesterol panel was very poorly controlled.  She tells me that she has had longstanding hypertension and has intolerance of several medications.  The only when she really tell me about was coughing with lisinopril.  (Reviewing of her "allergy list "has extensive list).  She has been on Bystolic for some time now, and seems to be tolerating the amlodipine despite it being listed as an allergy.  She has occasional irregular heartbeat/skipped beats but nothing prolonged.  No prolonged heart rate abnormalities.  CV Review of Symptoms (Summary) Cardiovascular ROS: positive for - chest pain, dyspnea on exertion, irregular heartbeat, palpitations, shortness of breath, and some flushing and dizziness associated with exertion negative for - edema, orthopnea, paroxysmal nocturnal dyspnea, rapid heart rate, or syncope/near syncope or TIA/amaurosis fugax, claudication  REVIEWED OF SYSTEMS   Review of Systems  Constitutional:  Positive for malaise/fatigue (Not sure if this is just from being lazy/sedentary or true fatigue.). Negative for chills, fever and weight loss.  HENT:  Negative for congestion and nosebleeds (Rare).   Respiratory:  Positive for cough (Nonproductive), shortness of breath (Somewhat baseline) and wheezing.   Cardiovascular:  Negative for chest pain, palpitations, claudication and leg swelling.       Per HPI  Gastrointestinal:  Positive for blood in stool (Hemorrhoidal) and constipation. Negative for melena.       Significantly painful prolapsed/inflamed hemorrhoid  Genitourinary:  Negative for hematuria.  Musculoskeletal:  Negative for back pain, falls and myalgias.  Neurological:  Negative for dizziness and focal weakness.  Endo/Heme/Allergies:  Negative for environmental allergies. Does not bruise/bleed easily.  Psychiatric/Behavioral:  Negative for depression and  memory loss. The patient is nervous/anxious. The patient does not have insomnia.    I have reviewed and (if needed) personally updated the patient's problem list, medications, allergies, past medical and surgical history, social and family history.   PAST MEDICAL HISTORY   Past Medical History:  Diagnosis Date   Alcohol abuse, daily use    Chest pain    Chronic bronchitis    COPD (chronic obstructive pulmonary disease) (HCC)    pt states diagnosed  by Dr. Kevan Ny   Coronary artery disease involving native coronary artery of native heart without angina pectoris    Diverticulitis    History of hypertension    Hyperlipidemia with target LDL less than 70 12/15/2011   Prolapsed hemorrhoids    SOB (shortness of breath)    when having chest pain   Tobacco abuse     PAST SURGICAL HISTORY   Past Surgical History:  Procedure Laterality Date   CARDIAC CTA  08/05/2021   LM: Focal protruding noncalcified plaque in the ost LM-no extravasation to suggest dissection (FFR 0.99).  Cannot r/o ulcerated plaque.  Nl caliber LAD w/ mixed cal/noncal plaque (1-24% prox).  LCx-large OM1 = prox LCx (1-24%).;  Prox RCA 50 to 69% mixed cal/noncal plaque.  Focal mixed 50-69% mid to dis RCA (high risk features).  Diffuse mixed 50-69 % dRCA => (FFR 0.98-0.93-.09) -- NOT SIGNIFICANT   CORONARY CALCIUM SCORE  07/15/2021   (Agatston) Score: 358, left main 78, LAD 15, RCA 254 and LCx 10.1.  Also noted aortic atherosclerosis   DOBUTAMINE STRESS ECHO  10/2012   EKG negative for ischemia (reached 93% maximum heart rate).  Echo negative for ischemia.  Baseline echo with normal EF and normal wall motion.   none      Immunization History  Administered Date(s) Administered   Moderna Sars-Covid-2 Vaccination 01/25/2020, 02/22/2020, 10/11/2020    MEDICATIONS/ALLERGIES    Current Outpatient Medications:    amLODipine (NORVASC) 10 MG tablet, Take 10 mg by mouth every morning., Disp: , Rfl: 4   aspirin EC 81 MG tablet,  Take 1 tablet (81 mg total) by mouth daily. Swallow whole., Disp: 90 tablet, Rfl: 3   BYSTOLIC 20 MG TABS, every evening., Disp: , Rfl: 4   hydrocortisone cream 1 %, Apply topically., Disp: , Rfl:    Ibuprofen (ADVIL PO), Take by mouth as needed., Disp: , Rfl:    rosuvastatin (CRESTOR) 20 MG tablet, Take 1 tablet (20 mg total) by mouth at bedtime., Disp: 90 tablet, Rfl: 3   Allergies  Allergen Reactions   Hydrochlorothiazide     Dizziness   Lisinopril Cough   Norvasc [Amlodipine] Other (See Comments)    Edema   Other     Nitrous Oxide   Oyster Shell Nausea And Vomiting    SOCIAL HISTORY/FAMILY HISTORY   Reviewed in Epic:  Pertinent findings:  Social History   Tobacco Use   Smoking status: Every Day    Packs/day: 2.00    Years: 37.00    Pack years: 74.00    Types: Cigarettes   Smokeless tobacco: Never  Vaping Use   Vaping Use: Former  Substance Use Topics   Alcohol use: Yes    Alcohol/week: 20.0 standard drinks    Types: 20 Cans of beer per week   Drug use: Yes    Types: Marijuana    Comment: Occasional marijuana use   Social History   Social History Narrative   Married.  Lives with [place].  They have no kids.   Long-term smoker 2 packs a day for 37 years and drinks 20 beers a week.   She is close personal friends with Loney Hering, RN (formerly from [employer]), and Luna Kitchens also from [facility]      Had previously been very active, but since she started working from home has become more more sedentary.  Now is essentially a "couch potato".      She purchased a treadmill,  and would like to start working on it, but is concerned now about her coronary calcium score.   Family History: Reviewed in epic.   OBJCTIVE -PE, EKG, labs   Wt Readings from Last 3 Encounters:  08/14/21 120 lb 12.8 oz (54.8 kg)  07/25/21 121 lb 9.6 oz (55.2 kg)  07/14/21 121 lb 9.6 oz (55.2 kg)    Physical Exam: BP 136/88   Pulse 80   Ht 5' 1.5" (1.562 m)    Wt 120 lb 12.8 oz (54.8 kg)   LMP 07/03/2013   SpO2 97%   BMI 22.46 kg/m  Physical Exam Vitals reviewed.  Constitutional:      General: She is not in acute distress.    Appearance: She is normal weight. She is not ill-appearing or toxic-appearing.     Comments: Well-nourished, well-groomed.  Healthy-appearing.  HENT:     Head: Normocephalic and atraumatic.  Neck:     Vascular: No carotid bruit or JVD.  Cardiovascular:     Rate and Rhythm: Normal rate and regular rhythm. Occasional Extrasystoles are present.    Chest Wall: PMI is not displaced.     Pulses: Normal pulses.     Heart sounds: Normal heart sounds, S1 normal and S2 normal. No murmur heard.   No friction rub. No gallop.  Pulmonary:     Effort: Pulmonary effort is normal. No respiratory distress.     Breath sounds: No wheezing (Faint expiratory wheeze), rhonchi or rales.  Chest:     Chest wall: No tenderness.  Musculoskeletal:        General: No swelling. Normal range of motion.     Cervical back: Normal range of motion and neck supple.  Neurological:     General: No focal deficit present.     Mental Status: She is alert and oriented to person, place, and time.     Cranial Nerves: No cranial nerve deficit.     Coordination: Coordination normal.  Psychiatric:        Mood and Affect: Mood normal.        Behavior: Behavior normal.        Thought Content: Thought content normal.        Judgment: Judgment normal.     Comments: Seems quite anxious     Adult ECG Report  Rate: 79 ;  Rhythm: normal sinus rhythm, premature atrial contractions (PAC), and incomplete RBBB. ; Otherwise normal axis, intervals and durations.  Narrative Interpretation: Borderline -> PACs noted to be in bigeminy pattern.  Otherwise relatively stable.  Recent Labs: 07/03/2021 Na+ 139, K+ 3.8, Cl- 101, HCO3-29, BUN 6, Cr 0.66, Glu 103, Ca2+ 10.2; 10.2 AST 23, ALT 20, AlkP 66; T Prot8.1, albumin 4.7. CBC: W 6.0, H/H 15.3/45.0, Plt 231 TC 262, TG  94, HDL 76, LDL 170**; TSH 1.27, vitamin D 31.8.   ==================================================  COVID-19 Education: The signs and symptoms of COVID-19 were discussed with the patient and how to seek care for testing (follow up with PCP or arrange E-visit).    I spent a total of 54 minutes with the patient spent in direct patient consultation.  Additional time spent with chart review  / charting (studies, outside notes, etc): 20 min Total Time: 74 min  Current medicines are reviewed at length with the patient today.  (+/- concerns) reluctant to take new meds.  This visit occurred during the SARS-CoV-2 public health emergency.  Safety protocols were in place, including screening questions prior to the visit, additional usage  of staff PPE, and extensive cleaning of exam room while observing appropriate contact time as indicated for disinfecting solutions.  Notice: This dictation was prepared with Dragon dictation along with smaller phrase technology. Any transcriptional errors that result from this process are unintentional and may not be corrected upon review.  Patient Instructions / Medication Changes & Studies & Tests Ordered   Patient Instructions  Medication Instructions:  See instruction below  One time Metoprolol  tartrate 100 mg prior to CCTA  Start taking Aspirin 81 mg Daily   Rosuvastatin 20 mg  take at bedtime  *If you need a refill on your cardiac medications before your next appointment, please call your pharmacy*   Lab Work: BMP- see instructions  If you have labs (blood work) drawn today and your tests are completely normal, you will receive your results only by: MyChart Message (if you have MyChart) OR A paper copy in the mail If you have any lab test that is abnormal or we need to change your treatment, we will call you to review the results.   Testing/Procedures: Will be schedule after insurance authorization is given. [address]  Your  physician has requested that you have cardiac CTA. Cardiac computed tomography (CT) is a painless test that uses an x-ray machine to take clear, detailed pictures of your heart. Please follow instruction sheet as given.     Follow-Up: At [facility], you and your health needs are our priority.  As part of our continuing mission to provide you with exceptional heart care, we have created designated Provider Care Teams.  These Care Teams include your primary Cardiologist (physician) and Advanced Practice Providers (APPs -  Physician Assistants and Nurse Practitioners) who all work together to provide you with the care you need, when you need it.     Your next appointment:   2 month(s)  The format for your next appointment:   In Person  Provider:   Bryan Lemma, MD   Other Instructions -> preparation for CT scan instructions.  Studies Ordered:   Orders Placed This Encounter  Procedures   Lipid panel   EKG 12-Lead      Bryan Lemma, M.D., M.S. Interventional Cardiologist   Pager # [phone] Phone # [phone] [address] [place], [place] [address]   Thank you for choosing [facility] at [facility]!!

## 2021-08-14 NOTE — Assessment & Plan Note (Signed)
Blood pressure still higher than we would like on amlodipine and Bystolic.  Her dentist did mention something about gum swelling from the amlodipine.  Maybe we could potentially come down the dose little bit and add additional medication.  Plan:  Will start moderate dose valsartan 160 mg daily (first week will take 1/2 tablet daily.  Continue amlodipine, follow pressures as we titrate up ARB, anticipate potentially backing down on the dose at the dentist request  Continue Bystolic.  Unfortunate I do not think we get much more benefit of any higher dose.

## 2021-08-14 NOTE — Assessment & Plan Note (Signed)
Smoking cessation instruction/counseling given:  counseled patient on the dangers of tobacco use, advised patient to stop smoking, and reviewed strategies to maximize success.  We discussed strategies.  Roughly 4-5 minutes spent.

## 2021-08-14 NOTE — Assessment & Plan Note (Signed)
There was concern basely coronary calcium score as far as preop evaluation, I think with negative CT FFR findings, having her on a stable dose of statin and beta-blocker should be cardioprotective enough to proceed with surgery.  With the left main RCA and LM being the areas of concern, both segment CT FFR was nonischemic.  At this point I do not think that invasive evaluation is warranted.  She is on a beta-blocker and statin now.  Would not change.  No further cardiac evaluation required prior to surgery.  Would be considered low risk patient for low risk procedure.

## 2021-09-02 ENCOUNTER — Ambulatory Visit: Payer: BC Managed Care – PPO | Admitting: Internal Medicine

## 2021-09-02 DIAGNOSIS — K642 Third degree hemorrhoids: Secondary | ICD-10-CM | POA: Diagnosis not present

## 2021-09-02 DIAGNOSIS — R195 Other fecal abnormalities: Secondary | ICD-10-CM | POA: Diagnosis not present

## 2021-09-25 DIAGNOSIS — E785 Hyperlipidemia, unspecified: Secondary | ICD-10-CM | POA: Diagnosis not present

## 2021-09-26 LAB — LIPID PANEL
Chol/HDL Ratio: 2.2 ratio (ref 0.0–4.4)
Cholesterol, Total: 184 mg/dL (ref 100–199)
HDL: 82 mg/dL (ref >39)
LDL Chol Calc (NIH): 80 mg/dL (ref 0–99)
Triglycerides: 130 mg/dL (ref 0–149)
VLDL Cholesterol Cal: 22 mg/dL (ref 5–40)

## 2021-09-28 ENCOUNTER — Encounter: Payer: Self-pay | Admitting: Cardiology

## 2021-09-28 NOTE — Progress Notes (Signed)
Primary Care Provider: Jonathon Jordan, MD Cardiologist: Jeanne Hew, MD Electrophysiologist: None  Clinic Note: Chief Complaint  Patient presents with   Follow-up    Feeling much better.  No further chest pain.  Exertional dyspnea improving with increased level exercise. Happy with cholesterol results.   Coronary Artery Disease    Nonobstructive disease noted on coronary CTA.  No active symptoms concerning for angina.   Colon Cancer Screening    Cologuard was positive meaning to delayed hemorrhoid surgery until colonoscopy can confirm or deny presence of colon cancer.  Colonoscopy scheduled for 11/17.-Dr. Collene Harris  Very anxious.  No further bleeding.   Hyperlipidemia    Lab results reviewed.  Has made diet changes and tolerating statin.    ===================================  ASSESSMENT/PLAN   Problem List Items Addressed This Visit       Cardiology Problems   Hyperlipidemia with target LDL less than 70 - Primary (Chronic)    With pretty significant coronary calcium score findings and plaque noted on CTA, target LDL should be less than 70 for reducing overall risk.  She had a dramatic improvement in her LDL down to 80 from 170 based on combination of dietary changes and statin.  For now we will continue with her current diet changes and statin and reassess at the time for 51-month follow-up.      Coronary artery disease, non-occlusive by coronary CTA (CT FFR negative for LM, RCA, LAD and LCx) (Chronic)    After reviewing her cardiac CTA images, she does currently have evidence of nonobstructive CAD -> with nonischemic FFRct, and the left main lesion is present, but not associated with any dangerous appearance of dissection or ulcerated plaque.  In the absence of active symptoms, we chose to avoid ischemic evaluation for fear potentially disrupting existing plaque.  She actually has increased her level activity and is not having any chest pain, pressure or dyspnea with rest or  exertion.  As such, I think the best course of action is to continue with medical management with aggressive risk factor modification and guideline directed medical therapy.  Plan:  Aggressive lipid management as noted,  blood pressure control already on beta-blocker and calcium channel blocker we have ACE inhibitor/ARB intolerance as well as HCTZ.   May potentially need to consider spironolactone Continue smoking cessation attempts.  She has cut down dramatically, but not yet ready to quit.  She probably will need assistance with potentially standard SSRI or Wellbutrin based on her underlying stress.      Essential hypertension (Chronic)    She clearly has situational hypertension associate with anxiety and stress.  Pressure today is high but on my recheck was more consistent with her checks.  Was more like 130/70 mmHg.  I would be reluctant to make any adjustments to her blood pressure medications at this point because that would entail adding a new medication and she is very intolerant of medications.  Probably would consider spironolactone as a next option if necessary.  I told her that she should take an additional 10 mg Bystolic for sustained elevated blood pressures greater than 160 mmHg        Other   Preop cardiovascular exam (Chronic)    Visual plan was for her to undergo hemorrhoid surgery, but this is been delayed for colonoscopy.  Regardless, with negative FFRct results on coronary CTA, despite there being evidence of lesions, there is no indication for any further testing in the absence of any active anginal symptoms.  In  fact she is feeling a lot better as she becomes more active.  She is on stable medications of statin beta-blocker and calcium channel blocker all of which mitigate any cardiac risk.  Colonoscopy is at very low risk procedure and hemorrhoidal surgery with also relatively low risk surgery.  She would be considered to be at low risk patient for low risk surgery.   Would not require any further cardiac evaluation at this time. Okay to hold aspirin 5 to 7 days preop if necessary.      DOE (dyspnea on exertion)    I suspect that her dyspnea is probably as much related to her smoking and probable COPD as it is anything else.  The fact that she is feeling better with exercise would indicate that is probably not cardiac in nature.  Encouraged continued exercise.      Tobacco abuse (Chronic)    We spent about 4- 5 minutes talking about smoking cessation.  Right now, she is very happy with the fact that she cut down from almost 2 packs a day to less than 1 pack a day.  This is a major step for her, but with lots of ongoing medical concerns-most notably Cologuard screening positive and need for hemorrhoid surgery, she is not yet ready to give up her 1 residual crutch.  Hopefully, once these 2 issues are have been addressed and she is not in the watchful waiting stage, she will be able to take more steps to fully quitting cigarettes.  She wants to which is the most important thing.  She understands the risks.     ===================================  HPI:    Jeanne Harris is a 55 y.o. female with a PMH notable for Long-Term Tobacco Use-with COPD, Hypertension, and SIGNIFICANT HYPERLIPIDEMIA as well as FAMILY HISTORY of PREMATURE CAD with Borderline/Not Critical Abnormal CORONARY CT ANGIOGRAM who is being seen today for 71-month follow-up  07/25/2021: Initial consult for Cor Ca++ Score >300. B/c of DOE & intermittent non-exertional CP (very sedentary), Cor CTA-FFR ordered as Pre-OP for potential hemorrhoidectomy.   Jeanne Harris was seen in follow-up with a coronary CTA on August 15, 2019 to discuss results FFR negative, but concerning Left MainPlaque.  Still not really having any significant symptoms of chest pain or pressure with rest or exertion.  Just exertional dyspnea decreased to be explained by deconditioning. ->  Since the CT FFR studies were  nonischemic, we chose to avoid invasive evaluation of the Left Main. She was started on Crestor 20 mg daily along with Bystolic, rosuvastatin and aspirin.  Smoking cessation counseling; encouraged increased exercise to monitor for symptoms. Added Valsartan 160 mg Cleared for surgery. Plan to close to 1 follow-up.  Recent Hospitalizations: None  She was seen in consult by Dr. Leighton Ruff for her prolapsed hemorrhoids. => Anoscopy showed grade 3 posterior internal hemorrhoids with significant external hemorrhoid swelling circumferentially. ->  She had her band ligation and started on MiraLAX.  Also referred for colonoscopy based on positive Cologuard test. NO Rx of Prolapsed hemorrhoid.\ Surgery delayed pending Colonoscopy   Reviewed  CV studies:    The following studies were reviewed today: (if available, images/films reviewed: From Epic Chart or Care Everywhere) No new studies  Interval History:   Jeanne Harris presents here for close follow-up having had her hemorrhoid banded, but still no Surgery until Colonoscopy.   Jeanne Harris has made significant strides in adjusting her diet and increasing her exercise level.  Her lipid panel looks amazingly  good dietary adjustment and low-dose statin.  She tells me her blood pressures are usually in the 120/70 range at home.  Today's pressure is because she is very anxious coming in.  She had some stressful episodes going on on before coming in. She is also made a significant effort cutting down her cigarettes to less than a pack a day.  She is working hard to try to quit, but until the stress of her Cologuard test results and need for hemorrhoidal surgery, she is under too much stress to actually stop at this point.  From a cardiac standpoint she is really not having any further episodes of chest discomfort or pressure with rest or exertion.  She still is a little short of breath if she overexerts, but is still somewhat deconditioned.  She does already  overreactive until the hemorrhoid issues resolved, but he is definitely starting to do more exercise and doing yard work etc.  She totally understands if she is quite deconditioned, and already notes improvement in exertional dyspnea since increasing her level of exercise.  CV Review of Symptoms (Summary)  Cardiovascular ROS: positive for - dyspnea on exertion, irregular heartbeat, shortness of breath, and s occasional little flushing/dizzy spells but short-lived.  Irregular heartbeats or very rare.  Baseline dyspnea with improved exertional dyspnea. negative for - chest pain, edema, orthopnea, paroxysmal nocturnal dyspnea, rapid heart rate, or syncope/near syncope or TIA/amaurosis fugax, claudication  REVIEWED OF SYSTEMS   Review of Systems  Constitutional:  Negative for chills, fever, malaise/fatigue (Now that she is trying to become less lazy, she has not necessarily noticing fatigue, dyspnea conditioning that is improving.) and weight loss.  HENT:  Negative for congestion and nosebleeds (Rare).   Respiratory:  Positive for cough (Chronic nonproductive cough mostly in the morning.), shortness of breath (Baseline) and wheezing (Also usually in the morning, clears with cough.).   Cardiovascular:  Negative for chest pain, palpitations, claudication and leg swelling.       Per HPI  Gastrointestinal:  Positive for blood in stool (She had one of her hemorrhoids banded, the other prolapsed hemorrhoid was pushed back in.  Has had less bleeding on the MiraLAX.) and constipation (Being treated with MiraLAX and stool softener by Dr. Marcello Moores). Negative for melena.       Significantly painful prolapsed/inflamed hemorrhoid  Genitourinary:  Negative for hematuria.  Musculoskeletal:  Negative for back pain, falls and myalgias.  Neurological:  Negative for dizziness and focal weakness.  Endo/Heme/Allergies:  Negative for environmental allergies. Does not bruise/bleed easily.  Psychiatric/Behavioral:  Negative  for depression and memory loss. The patient is nervous/anxious. The patient does not have insomnia.        Still quite stressed out, especially now with Cologuard positive result.   I have reviewed and (if needed) personally updated the patient's problem list, medications, allergies, past medical and surgical history, social and family history.   PAST MEDICAL HISTORY   Past Medical History:  Diagnosis Date   Alcohol abuse, daily use    Chest pain    Chronic bronchitis    COPD (chronic obstructive pulmonary disease) (HCC)    pt states diagnosed by Dr. Inda Merlin   Coronary artery disease involving native coronary artery of native heart without angina pectoris 08/05/2021   Coronary CTA: Ost LM - Focal protruding nonCa+ plaque (not dissection, FFR 0.99).  Cannot r/o ulcerated plaque.  Nl caliber LAD w/ mixed cal/noncal plaque (1-24% prox).  LCx-large OM1 = prox LCx (1-24%).;  Prox RCA 50  to 69% mixed cal/noncal plaque.  Focal mixed 50-69% mid to dis RCA (high risk features).  Diffuse mixed 50-69 % dRCA => (FFR 0.98-0.93-.09) -- NOT SIGNIFICANT;   Diverticulitis    History of hypertension    Hyperlipidemia with target LDL less than 70 12/15/2011   Prolapsed hemorrhoids    SOB (shortness of breath)    when having chest pain   Tobacco abuse     PAST SURGICAL HISTORY   Past Surgical History:  Procedure Laterality Date   CARDIAC CTA  08/05/2021   LM: Focal protruding noncalcified plaque in the ost LM-no extravasation to suggest dissection (FFR 0.99).  Cannot r/o ulcerated plaque.  Nl caliber LAD w/ mixed cal/noncal plaque (1-24% prox).  LCx-large OM1 = prox LCx (1-24%).;  Prox RCA 50 to 69% mixed cal/noncal plaque.  Focal mixed 50-69% mid to dis RCA (high risk features).  Diffuse mixed 50-69 % dRCA => (FFR 0.98-0.93-.09) -- NOT SIGNIFICANT   CORONARY CALCIUM SCORE  07/15/2021   (Agatston) Score: 358, left main 78, LAD 15, RCA 254 and LCx 10.1.  Also noted aortic atherosclerosis   DOBUTAMINE  STRESS ECHO  10/2012   EKG negative for ischemia (reached 93% maximum heart rate).  Echo negative for ischemia.  Baseline echo with normal EF and normal wall motion.   none      Immunization History  Administered Date(s) Administered   Moderna Sars-Covid-2 Vaccination 01/25/2020, 02/22/2020, 10/11/2020    MEDICATIONS/ALLERGIES   Current Meds  Medication Sig   amLODipine (NORVASC) 10 MG tablet Take 10 mg by mouth every morning.   aspirin EC 81 MG tablet Take 1 tablet (81 mg total) by mouth daily. Swallow whole.   BYSTOLIC 20 MG TABS every evening.   hydrocortisone cream 1 % Apply topically.   Ibuprofen (ADVIL PO) Take by mouth as needed.   polyethylene glycol powder (MIRALAX) 17 GM/SCOOP powder Take 1 Container by mouth once.   rosuvastatin (CRESTOR) 20 MG tablet Take 1 tablet (20 mg total) by mouth at bedtime.    Current Outpatient Medications:    amLODipine (NORVASC) 10 MG tablet, Take 10 mg by mouth every morning., Disp: , Rfl: 4   aspirin EC 81 MG tablet, Take 1 tablet (81 mg total) by mouth daily. Swallow whole., Disp: 90 tablet, Rfl: 3   BYSTOLIC 20 MG TABS, every evening., Disp: , Rfl: 4   hydrocortisone cream 1 %, Apply topically., Disp: , Rfl:    Ibuprofen (ADVIL PO), Take by mouth as needed., Disp: , Rfl:    polyethylene glycol powder (MIRALAX) 17 GM/SCOOP powder, Take 1 Container by mouth once., Disp: , Rfl:    rosuvastatin (CRESTOR) 20 MG tablet, Take 1 tablet (20 mg total) by mouth at bedtime., Disp: 90 tablet, Rfl: 3   Allergies  Allergen Reactions   Hydrochlorothiazide     Dizziness   Lisinopril Cough   Norvasc [Amlodipine] Other (See Comments)    Edema   Other     Nitrous Oxide   Oyster Shell Nausea And Vomiting    SOCIAL HISTORY/FAMILY HISTORY   Reviewed in Epic:  Pertinent findings:  Social History   Tobacco Use   Smoking status: Every Day    Packs/day: 2.00    Years: 37.00    Pack years: 74.00    Types: Cigarettes   Smokeless tobacco: Never   Vaping Use   Vaping Use: Former  Substance Use Topics   Alcohol use: Yes    Alcohol/week: 20.0 standard drinks  Types: 20 Cans of beer per week   Drug use: Yes    Types: Marijuana    Comment: Occasional marijuana use   Social History   Social History Narrative   Married.  Lives with [place].  They have no kids.   Long-term smoker 2 packs a day for 37 years and drinks 20 beers a week.   She is close personal friends with Woody Seller, RN (formerly from [employer]), and Satira Sark also from [employer]      Had previously been very active, but since she started working from home has become more more sedentary.  Now is essentially a "couch potato".      She purchased a treadmill, and would like to start working on it, but is concerned now about her coronary calcium score.   Family History: Reviewed in epic.   OBJCTIVE -PE, EKG, labs   Wt Readings from Last 3 Encounters:  09/29/21 120 lb 12.8 oz (54.8 kg)  08/14/21 120 lb 12.8 oz (54.8 kg)  07/25/21 121 lb 9.6 oz (55.2 kg)    Physical Exam: BP (!) 154/80 (BP Location: Left Arm, Patient Position: Sitting, Cuff Size: Normal) Comment: Blood pressure this morning at home was 120/75 mmHg.  Pulse 76   Ht 5\' 2"  (1.575 m)   Wt 120 lb 12.8 oz (54.8 kg)   LMP 07/03/2013   SpO2 98%   BMI 22.09 kg/m  Physical Exam Vitals reviewed.  Constitutional:      General: She is not in acute distress.    Appearance: Normal appearance. She is normal weight. She is not ill-appearing or toxic-appearing.     Comments: Well-nourished, well-groomed.  Healthy-appearing.  In better spirits overall.  HENT:     Head: Normocephalic and atraumatic.  Neck:     Vascular: No carotid bruit or JVD.  Cardiovascular:     Rate and Rhythm: Normal rate and regular rhythm. Occasional Extrasystoles (Rare) are present.    Chest Wall: PMI is not displaced.     Pulses: Normal pulses and intact distal pulses.     Heart sounds: Normal heart  sounds, S1 normal and S2 normal. No murmur heard.   No friction rub. No gallop.  Pulmonary:     Effort: Pulmonary effort is normal. No respiratory distress.     Breath sounds: Normal breath sounds. Wheezes: Faint expiratory wheeze.     Comments: She had mild basal crackles that clear with cough. Chest:     Chest wall: No tenderness.  Musculoskeletal:        General: No swelling. Normal range of motion.     Cervical back: Normal range of motion and neck supple.  Neurological:     General: No focal deficit present.     Mental Status: She is alert and oriented to person, place, and time.     Cranial Nerves: No cranial nerve deficit.     Coordination: Coordination normal.  Psychiatric:        Mood and Affect: Mood normal.        Behavior: Behavior normal.        Thought Content: Thought content normal.        Judgment: Judgment normal.     Comments: Less anxious, is still stressed about her upcoming colonoscopy.     Adult ECG Report Not checked  Recent Labs: Reviewed with patient today.  Excellent response to statin and diet change. Lab Results  Component Value Date   CHOL 184  09/25/2021   HDL 82 09/25/2021   LDLCALC 80 09/25/2021   TRIG 130 09/25/2021   CHOLHDL 2.2 09/25/2021   Lab Results  Component Value Date   CREATININE 0.61 07/31/2021   BUN 7 07/31/2021   NA 140 07/31/2021   K 4.4 07/31/2021   CL 100 07/31/2021   CO2 21 07/31/2021    07/03/2021 Na+ 139, K+ 3.8, Cl- 101, HCO3-29, BUN 6, Cr 0.66, Glu 103, Ca2+ 10.2; 10.2 AST 23, ALT 20, AlkP 66; T Prot8.1, albumin 4.7. CBC: W 6.0, H/H 15.3/45.0, Plt 231 TC 262, TG 94, HDL 76, LDL 170**; TSH 1.27, vitamin D 31.8.   ==================================================  COVID-19 Education: The signs and symptoms of COVID-19 were discussed with the patient and how to seek care for testing (follow up with PCP or arrange E-visit).    I spent a total of 30 minutes with the patient spent in direct patient consultation.   Additional time spent with chart review  / charting (studies, outside notes, etc): 15 min (Pre-Charting 10 min) Total Time: 55 min  Current medicines are reviewed at length with the patient today.  (+/- concerns) tolerating rosuvastatin.  This visit occurred during the SARS-CoV-2 public health emergency.  Safety protocols were in place, including screening questions prior to the visit, additional usage of staff PPE, and extensive cleaning of exam room while observing appropriate contact time as indicated for disinfecting solutions.  Notice: This dictation was prepared with Dragon dictation along with smaller phrase technology. Any transcriptional errors that result from this process are unintentional and may not be corrected upon review.  Studies Ordered:  No orders of the defined types were placed in this encounter.  Patient Instructions / Medication Changes & Studies & Tests Ordered   Patient Instructions  Medication Instructions:  No changes  *If you need a refill on your cardiac medications before your next appointment, please call your pharmacy*   Lab Work: Not needed    Testing/Procedures: Not needed   Follow-Up: At [facility], you and your health needs are our priority.  As part of our continuing mission to provide you with exceptional heart care, we have created designated Provider Care Teams.  These Care Teams include your primary Cardiologist (physician) and Advanced Practice Providers (APPs -  Physician Assistants and Nurse Practitioners) who all work together to provide you with the care you need, when you need it.     Your next appointment:   6 month(s)  The format for your next appointment:   In Person  Provider:   Bryan Lemma, MD    ADDITIONAL INSTRUCTIONS Preop assessment for colonoscopy-Low risk patient for low risk procedure  -> Nonischemic coronary CTA findings with no active angina symptoms. On stable dose of beta-blocker and calcium channel  blocker along with statin and aspirin. ->  Okay to hold aspirin     Bryan Lemma, M.D., M.S. Interventional Cardiologist   Pager # [phone] Phone # [phone] [address] [place], [place] [address]   Thank you for choosing Heart[facility] at [facility]!!

## 2021-09-29 ENCOUNTER — Ambulatory Visit (INDEPENDENT_AMBULATORY_CARE_PROVIDER_SITE_OTHER): Payer: BC Managed Care – PPO | Admitting: Cardiology

## 2021-09-29 ENCOUNTER — Other Ambulatory Visit: Payer: Self-pay

## 2021-09-29 ENCOUNTER — Encounter: Payer: Self-pay | Admitting: Cardiology

## 2021-09-29 VITALS — BP 154/80 | HR 76 | Ht 62.0 in | Wt 120.8 lb

## 2021-09-29 DIAGNOSIS — Z01810 Encounter for preprocedural cardiovascular examination: Secondary | ICD-10-CM | POA: Diagnosis not present

## 2021-09-29 DIAGNOSIS — I2510 Atherosclerotic heart disease of native coronary artery without angina pectoris: Secondary | ICD-10-CM

## 2021-09-29 DIAGNOSIS — R0609 Other forms of dyspnea: Secondary | ICD-10-CM

## 2021-09-29 DIAGNOSIS — E785 Hyperlipidemia, unspecified: Secondary | ICD-10-CM

## 2021-09-29 DIAGNOSIS — I10 Essential (primary) hypertension: Secondary | ICD-10-CM

## 2021-09-29 DIAGNOSIS — Z720 Tobacco use: Secondary | ICD-10-CM

## 2021-09-29 DIAGNOSIS — F17210 Nicotine dependence, cigarettes, uncomplicated: Secondary | ICD-10-CM

## 2021-09-29 NOTE — Patient Instructions (Addendum)
Medication Instructions:  No changes  *If you need a refill on your cardiac medications before your next appointment, please call your pharmacy*   Lab Work: Not needed    Testing/Procedures: Not needed   Follow-Up: At [facility], you and your health needs are our priority.  As part of our continuing mission to provide you with exceptional heart care, we have created designated Provider Care Teams.  These Care Teams include your primary Cardiologist (physician) and Advanced Practice Providers (APPs -  Physician Assistants and Nurse Practitioners) who all work together to provide you with the care you need, when you need it.     Your next appointment:   6 month(s)  The format for your next appointment:   In Person  Provider:   Bryan Lemma, MD    ADDITIONAL INSTRUCTIONS Preop assessment for colonoscopy-Low risk patient for low risk procedure  -> Nonischemic coronary CTA findings with no active angina symptoms. On stable dose of beta-blocker and calcium channel blocker along with statin and aspirin. ->  Okay to hold aspirin

## 2021-10-01 ENCOUNTER — Encounter: Payer: Self-pay | Admitting: Cardiology

## 2021-10-01 NOTE — Assessment & Plan Note (Signed)
Visual plan was for her to undergo hemorrhoid surgery, but this is been delayed for colonoscopy.  Regardless, with negative FFRct results on coronary CTA, despite there being evidence of lesions, there is no indication for any further testing in the absence of any active anginal symptoms.  In fact she is feeling a lot better as she becomes more active.  She is on stable medications of statin beta-blocker and calcium channel blocker all of which mitigate any cardiac risk.  Colonoscopy is at very low risk procedure and hemorrhoidal surgery with also relatively low risk surgery.  She would be considered to be at low risk patient for low risk surgery.  Would not require any further cardiac evaluation at this time. Okay to hold aspirin 5 to 7 days preop if necessary.

## 2021-10-01 NOTE — Assessment & Plan Note (Signed)
I suspect that her dyspnea is probably as much related to her smoking and probable COPD as it is anything else.  The fact that she is feeling better with exercise would indicate that is probably not cardiac in nature.  Encouraged continued exercise.

## 2021-10-01 NOTE — Assessment & Plan Note (Signed)
With pretty significant coronary calcium score findings and plaque noted on CTA, target LDL should be less than 70 for reducing overall risk.  She had a dramatic improvement in her LDL down to 80 from 170 based on combination of dietary changes and statin.  For now we will continue with her current diet changes and statin and reassess at the time for 49-month follow-up.

## 2021-10-01 NOTE — Assessment & Plan Note (Signed)
She clearly has situational hypertension associate with anxiety and stress.  Pressure today is high but on my recheck was more consistent with her checks.  Was more like 130/70 mmHg.  I would be reluctant to make any adjustments to her blood pressure medications at this point because that would entail adding a new medication and she is very intolerant of medications.  Probably would consider spironolactone as a next option if necessary.  I told her that she should take an additional 10 mg Bystolic for sustained elevated blood pressures greater than 160 mmHg

## 2021-10-01 NOTE — Assessment & Plan Note (Signed)
After reviewing her cardiac CTA images, she does currently have evidence of nonobstructive CAD -> with nonischemic FFRct, and the left main lesion is present, but not associated with any dangerous appearance of dissection or ulcerated plaque.  In the absence of active symptoms, we chose to avoid ischemic evaluation for fear potentially disrupting existing plaque.  She actually has increased her level activity and is not having any chest pain, pressure or dyspnea with rest or exertion.  As such, I think the best course of action is to continue with medical management with aggressive risk factor modification and guideline directed medical therapy.  Plan:   Aggressive lipid management as noted,   blood pressure control already on beta-blocker and calcium channel blocker we have ACE inhibitor/ARB intolerance as well as HCTZ.    May potentially need to consider spironolactone  Continue smoking cessation attempts.  She has cut down dramatically, but not yet ready to quit.  She probably will need assistance with potentially standard SSRI or Wellbutrin based on her underlying stress.

## 2021-10-01 NOTE — Assessment & Plan Note (Signed)
We spent about 4- 5 minutes talking about smoking cessation.  Right now, she is very happy with the fact that she cut down from almost 2 packs a day to less than 1 pack a day.  This is a major step for her, but with lots of ongoing medical concerns-most notably Cologuard screening positive and need for hemorrhoid surgery, she is not yet ready to give up her 1 residual crutch.  Hopefully, once these 2 issues are have been addressed and she is not in the watchful waiting stage, she will be able to take more steps to fully quitting cigarettes.  She wants to which is the most important thing.  She understands the risks.

## 2021-10-02 DIAGNOSIS — Z1211 Encounter for screening for malignant neoplasm of colon: Secondary | ICD-10-CM | POA: Diagnosis not present

## 2021-10-02 DIAGNOSIS — R195 Other fecal abnormalities: Secondary | ICD-10-CM | POA: Diagnosis not present

## 2021-10-02 DIAGNOSIS — K5730 Diverticulosis of large intestine without perforation or abscess without bleeding: Secondary | ICD-10-CM | POA: Diagnosis not present

## 2021-10-06 DIAGNOSIS — K642 Third degree hemorrhoids: Secondary | ICD-10-CM | POA: Diagnosis not present

## 2021-10-29 DIAGNOSIS — I2510 Atherosclerotic heart disease of native coronary artery without angina pectoris: Secondary | ICD-10-CM | POA: Diagnosis not present

## 2021-10-29 DIAGNOSIS — R195 Other fecal abnormalities: Secondary | ICD-10-CM | POA: Diagnosis not present

## 2021-10-29 DIAGNOSIS — J441 Chronic obstructive pulmonary disease with (acute) exacerbation: Secondary | ICD-10-CM | POA: Diagnosis not present

## 2021-11-04 DIAGNOSIS — K641 Second degree hemorrhoids: Secondary | ICD-10-CM | POA: Diagnosis not present

## 2021-12-07 LAB — COLOGUARD
COLOGUARD: NEGATIVE
Cologuard: NEGATIVE

## 2021-12-08 ENCOUNTER — Encounter (HOSPITAL_BASED_OUTPATIENT_CLINIC_OR_DEPARTMENT_OTHER): Payer: Self-pay | Admitting: *Deleted

## 2021-12-15 ENCOUNTER — Telehealth: Payer: Self-pay | Admitting: Cardiology

## 2021-12-15 DIAGNOSIS — I2510 Atherosclerotic heart disease of native coronary artery without angina pectoris: Secondary | ICD-10-CM

## 2021-12-15 DIAGNOSIS — E785 Hyperlipidemia, unspecified: Secondary | ICD-10-CM

## 2021-12-15 NOTE — Telephone Encounter (Signed)
°  Patient has scheduled her follow up appointment and would like to get lab work done before the appointment if it is needed. I did not see any lab orders in the system. Does she need to have any done?

## 2021-12-15 NOTE — Telephone Encounter (Signed)
Patient has appt scheduled May 2023 Routed to primary nurse to review request for lab work

## 2022-02-06 DIAGNOSIS — K648 Other hemorrhoids: Secondary | ICD-10-CM | POA: Diagnosis not present

## 2022-03-03 DIAGNOSIS — K644 Residual hemorrhoidal skin tags: Secondary | ICD-10-CM | POA: Diagnosis not present

## 2022-03-20 NOTE — Addendum Note (Signed)
Addended by: Raiford Simmonds on: 03/20/2022 04:40 PM ? ? Modules accepted: Orders ? ?

## 2022-03-20 NOTE — Telephone Encounter (Signed)
Informed patient CMP and lipid is needed prior to appointment. Instruction given  patient verbalized understanding. ?

## 2022-04-02 DIAGNOSIS — I2510 Atherosclerotic heart disease of native coronary artery without angina pectoris: Secondary | ICD-10-CM | POA: Diagnosis not present

## 2022-04-02 DIAGNOSIS — E785 Hyperlipidemia, unspecified: Secondary | ICD-10-CM | POA: Diagnosis not present

## 2022-04-02 LAB — COMPREHENSIVE METABOLIC PANEL
ALT: 20 IU/L (ref 0–32)
AST: 28 IU/L (ref 0–40)
Albumin/Globulin Ratio: 1.7 (ref 1.2–2.2)
Albumin: 4.9 g/dL (ref 3.8–4.9)
Alkaline Phosphatase: 86 IU/L (ref 44–121)
BUN/Creatinine Ratio: 5 — ABNORMAL LOW (ref 9–23)
BUN: 3 mg/dL — ABNORMAL LOW (ref 6–24)
Bilirubin Total: 0.2 mg/dL (ref 0.0–1.2)
CO2: 22 mmol/L (ref 20–29)
Calcium: 9.6 mg/dL (ref 8.7–10.2)
Chloride: 99 mmol/L (ref 96–106)
Creatinine, Ser: 0.58 mg/dL (ref 0.57–1.00)
Globulin, Total: 2.9 g/dL (ref 1.5–4.5)
Glucose: 87 mg/dL (ref 70–99)
Potassium: 3.8 mmol/L (ref 3.5–5.2)
Sodium: 137 mmol/L (ref 134–144)
Total Protein: 7.8 g/dL (ref 6.0–8.5)
eGFR: 106 mL/min/{1.73_m2} (ref >59)

## 2022-04-02 LAB — LIPID PANEL
Chol/HDL Ratio: 1.8 ratio (ref 0.0–4.4)
Cholesterol, Total: 183 mg/dL (ref 100–199)
HDL: 99 mg/dL (ref >39)
LDL Chol Calc (NIH): 68 mg/dL (ref 0–99)
Triglycerides: 89 mg/dL (ref 0–149)
VLDL Cholesterol Cal: 16 mg/dL (ref 5–40)

## 2022-04-06 ENCOUNTER — Ambulatory Visit (INDEPENDENT_AMBULATORY_CARE_PROVIDER_SITE_OTHER): Payer: BC Managed Care – PPO | Admitting: Cardiology

## 2022-04-06 ENCOUNTER — Encounter: Payer: Self-pay | Admitting: Cardiology

## 2022-04-06 VITALS — BP 132/76 | HR 79 | Ht 62.0 in | Wt 114.6 lb

## 2022-04-06 DIAGNOSIS — Z720 Tobacco use: Secondary | ICD-10-CM

## 2022-04-06 DIAGNOSIS — I2510 Atherosclerotic heart disease of native coronary artery without angina pectoris: Secondary | ICD-10-CM

## 2022-04-06 DIAGNOSIS — R0609 Other forms of dyspnea: Secondary | ICD-10-CM

## 2022-04-06 DIAGNOSIS — K648 Other hemorrhoids: Secondary | ICD-10-CM

## 2022-04-06 DIAGNOSIS — E785 Hyperlipidemia, unspecified: Secondary | ICD-10-CM | POA: Diagnosis not present

## 2022-04-06 DIAGNOSIS — I10 Essential (primary) hypertension: Secondary | ICD-10-CM | POA: Diagnosis not present

## 2022-04-06 MED ORDER — AMLODIPINE BESYLATE 5 MG PO TABS: 5.0000 mg | ORAL_TABLET | Freq: Every day | ORAL | 3 refills | Status: DC

## 2022-04-06 NOTE — Patient Instructions (Signed)
Medication Instructions:   Decrease dose of Amlodipine to 5 mg daily   *If you need a refill on your cardiac medications before your next appointment, please call your pharmacy*   Lab Work: Not needed    Testing/Procedures: Not needed    Follow-Up: At [facility], you and your health needs are our priority.  As part of our continuing mission to provide you with exceptional heart care, we have created designated Provider Care Teams.  These Care Teams include your primary Cardiologist (physician) and Advanced Practice Providers (APPs -  Physician Assistants and Nurse Practitioners) who all work together to provide you with the care you need, when you need it.     Your next appointment:   6 month(s)  The format for your next appointment:   In Person  Provider:   Bryan Lemma, MD    Other Instructions  Keep a  record of blood pressure - in a journal

## 2022-04-06 NOTE — Progress Notes (Signed)
Primary Care Provider: Mila Palmer, MD Cardiologist: Bryan Lemma, MD Electrophysiologist: None  Clinic Note: Chief Complaint  Patient presents with   Follow-up    6 months; a little upset-her mother just passed away at age 56.   Coronary Artery Disease    Nonobstructive CAD by Coronary CTA: No active chest pain with exertion.  She has chest pain several minutes after exerting.    ===================================  ASSESSMENT/PLAN   Problem List Items Addressed This Visit       Cardiology Problems   Hyperlipidemia with target LDL less than 70 (Chronic)    With possible left main calcification and minimize disease, we are targeting LDL less than 70. Her LDL has gone down from 80-68 from November 2022 25 Mar 2022.  She seems to be tolerating her modest dose rosuvastatin and is doing a great job.  At target.  Other labs look great.      Relevant Medications   amLODipine (NORVASC) 5 MG tablet   Other Relevant Orders   EKG 12-Lead (Completed)   Coronary artery disease, non-occlusive by coronary CTA (CT FFR negative for LM, RCA, LAD and LCx) - Primary (Chronic)    Likely moderate disease in the RCA as well as clearly disease in the left main.  No flow-limiting lesions noted on FFRCT.  Plan: Aggressive risk factor modification Targeting LDL less than 70, at that goal currently on rosuvastatin. BP control using beta-blocker as well as calcium channel blocker bradycardia anginal benefit, may need to consider ARB +/- spironolactone.  (She has an allergy to amlodipine listed, but is on amlodipine and tolerating it okay, with exception of edema.) => Reducing dose of amlodipine but thoughts potentially challenging with newer generation ARB. Continue aspirin for prophylaxis.-Okay to hold 5 to 7 days preop surgery.      Relevant Medications   amLODipine (NORVASC) 5 MG tablet   Other Relevant Orders   EKG 12-Lead (Completed)   Essential hypertension (Chronic)    Blood pressure  looks pretty well controlled today.  She does have a history of situational hypertension and she is somewhat upset and distressed of late with her mother's passing. She is having edema which seems to correlate with increasing up her amlodipine dose.  Plan: We will reduce to 5 mg amlodipine, and may need to consider attempted newer generation ARB +/- diuretic, if better blood control is required..      Relevant Medications   amLODipine (NORVASC) 5 MG tablet   Other Relevant Orders   EKG 12-Lead (Completed)   Hemorrhoid prolapse (Chronic)    Still painful.  Plan for discussion with Dr. Maisie Fus is expectant management with stool bulking/softening and working on tendency to push.  Hoping to avoid constipation as well.      Relevant Medications   amLODipine (NORVASC) 5 MG tablet     Other   DOE (dyspnea on exertion) (Chronic)    Seems to be stable.  Certainly not worse.I suspect this is probably more more related to smoking history than CAD.      Tobacco abuse (Chronic)    She is working on quitting but still not to where we need to be.  Some in the watchful waiting stage.  We discussed the need for smoking cessation, but she is still not ready.  Says at present until she gets over her mother's death, she would not be able to it. <2 minutes.      ===================================  HPI:    Jeanne Harris is a  56 y.o. female with a PMH below who presents today for 6-70-month follow-up of Moderate-Severe CAD at the request of Mila Palmer, MD.  Pertinent PMH PMH: Long-Term Tobacco Use-with COPD,  Hypertension,  SIGNIFICANT HYPERLIPIDEMIA a FAMILY HISTORY of PREMATURE CAD with Borderline/Not Critical Abnormal CORONARY CT ANGIOGRAM  Prolapse Hemorrhoids  TENLEIGH BYER was last seen on 09/29/2021 => follow-up after consultation for possible hemorrhoids surgery; lipid panel made significant improvement on low-dose statin.  Blood pressures were doing well.  At home in the 120/70  range.  Working on cutting down cigarettes-less than half pack a day.  Anxious about Cologuard test results. => No cardiac symptoms.  Recent Hospitalizations: None.  She is up not going forward with surgery.  Plan was for her to work on stool bulking and get a handle of her "pushing.  The hope was to minimize the size of the hemorrhoids and try to avoid surgery.  Reviewed  CV studies:    The following studies were reviewed today: (if available, images/films reviewed: From Epic Chart or Care Everywhere) No new studies:  Interval History:   ROZINA POINTER returns today for delayed 28-month follow-up overall doing okay from a cardiac standpoint.  She is just a little bit upset however and having a hard time dealing with the fact that her mother recently passed away at age 29.  She has not taken it well and is having issues with poor sleep and having nightmares.  She understood that her mother was old but she just misses her.  Having a lot of anxiety issues.  She says that after doing yard work every now and then she feels some chest discomfort but it seems mostly musculoskeletal in nature because it is better with rubbing her chest and certain things that she does can make it worse.  Not necessarily just exertion.  She is lost a lot of weight mostly because she just does not really feel like he wants to eat and has been trying adjust her diet to help with the hemorrhoids.  No exertional chest tightness or pressure.  No exertional dyspnea.  She is trying to be active, but since her mother's death she has been less active. She has been noticing a lot of swelling, generalized in the hands and feet since we increased her amlodipine dose.  CV Review of Symptoms (Summary) Cardiovascular ROS: positive for - chest pain, dyspnea on exertion, edema, palpitations, and exertional dyspnea is actually better the more she has been active.  Mild musculoskeletal chest pain noted.  Not worse with exertion.  Worse  after having inserted.  Rare palpitations negative for - orthopnea, paroxysmal nocturnal dyspnea, rapid heart rate, shortness of breath, or lightheadedness, dizziness or wooziness, syncope/near STEMI or TIA/amaurosis fugax, claudication  REVIEWED OF SYSTEMS   Review of Systems  Constitutional:  Positive for malaise/fatigue (Still has easy fatigue, but doing better; still deconditioned) and weight loss (Trying to adjust her diet, but also related to mourning her mother.).  Respiratory:  Positive for shortness of breath (Previous baseline if not improved.).   Cardiovascular:  Positive for chest pain (Diffuse chest wall pain after mowing the lawn and doing other yard work.  Not while active, but 20 to 30 minutes after) and leg swelling. Negative for claudication.  Gastrointestinal:  Positive for abdominal pain, blood in stool (Less prominent, still related to hemorrhoids) and constipation (Working on it). Negative for melena.  Genitourinary:  Negative for dysuria and hematuria.  Musculoskeletal:  Positive for joint pain (  Mild). Negative for falls and myalgias.  Neurological:  Negative for dizziness, focal weakness, weakness and headaches.  Endo/Heme/Allergies:  Does not bruise/bleed easily.  Psychiatric/Behavioral:  Negative for depression (Just sad.  Her mother passed away not to [place].) and memory loss. The patient is nervous/anxious.     I have reviewed and (if needed) personally updated the patient's problem list, medications, allergies, past medical and surgical history, social and family history.   PAST MEDICAL HISTORY   Past Medical History:  Diagnosis Date   Alcohol abuse, daily use    Chest pain    Chronic bronchitis    COPD (chronic obstructive pulmonary disease) (HCC)    pt states diagnosed by Dr. Kevan Ny   Coronary artery disease involving native coronary artery of native heart without angina pectoris 08/05/2021   Coronary CTA: Ost LM - Focal protruding nonCa+ plaque (not  dissection, FFR 0.99).  Cannot r/o ulcerated plaque.  Nl caliber LAD w/ mixed cal/noncal plaque (1-24% prox).  LCx-large OM1 = prox LCx (1-24%).;  Prox RCA 50 to 69% mixed cal/noncal plaque.  Focal mixed 50-69% mid to dis RCA (high risk features).  Diffuse mixed 50-69 % dRCA => (FFR 0.98-0.93-.09) -- NOT SIGNIFICANT;   Diverticulitis    History of hypertension    Hyperlipidemia with target LDL less than 70 12/15/2011   Prolapsed hemorrhoids    SOB (shortness of breath)    when having chest pain   Tobacco abuse     PAST SURGICAL HISTORY   Past Surgical History:  Procedure Laterality Date   CARDIAC CTA  08/05/2021   LM: Focal protruding noncalcified plaque in the ost LM-no extravasation to suggest dissection (FFR 0.99).  Cannot r/o ulcerated plaque.  Nl caliber LAD w/ mixed cal/noncal plaque (1-24% prox).  LCx-large OM1 = prox LCx (1-24%).;  Prox RCA 50 to 69% mixed cal/noncal plaque.  Focal mixed 50-69% mid to dis RCA (high risk features).  Diffuse mixed 50-69 % dRCA => (FFR 0.98-0.93-.09) -- NOT SIGNIFICANT   CORONARY CALCIUM SCORE  07/15/2021   (Agatston) Score: 358, left main 78, LAD 15, RCA 254 and LCx 10.1.  Also noted aortic atherosclerosis   DOBUTAMINE STRESS ECHO  10/2012   EKG negative for ischemia (reached 93% maximum heart rate).  Echo negative for ischemia.  Baseline echo with normal EF and normal wall motion.   none      Immunization History  Administered Date(s) Administered   Moderna Sars-Covid-2 Vaccination 01/25/2020, 02/22/2020, 10/11/2020    MEDICATIONS/ALLERGIES   Current Meds  Medication Sig   amLODipine (NORVASC) 10 MG tablet Take 10 mg by mouth every morning.   aspirin EC 81 MG tablet Take 1 tablet (81 mg total) by mouth daily. Swallow whole.   BYSTOLIC 20 MG TABS every evening.   hydrocortisone cream 1 % Apply topically.   Ibuprofen (ADVIL PO) Take by mouth as needed.   polyethylene glycol powder (GLYCOLAX/MIRALAX) 17 GM/SCOOP powder Take 1 Container by  mouth once.   rosuvastatin (CRESTOR) 20 MG tablet Take 1 tablet (20 mg total) by mouth at bedtime.    Allergies  Allergen Reactions   Hydrochlorothiazide     Dizziness   Lisinopril Cough   Norvasc [Amlodipine] Other (See Comments)    Edema   Other     Nitrous Oxide   Oyster Shell Nausea And Vomiting    SOCIAL HISTORY/FAMILY HISTORY   Reviewed in Epic:  Pertinent findings:  Social History   Tobacco Use   Smoking status: Every Day  Packs/day: 2.00    Years: 37.00    Total pack years: 74.00    Types: Cigarettes   Smokeless tobacco: Never  Vaping Use   Vaping Use: Former  Substance Use Topics   Alcohol use: Yes    Alcohol/week: 20.0 standard drinks of alcohol    Types: 20 Cans of beer per week   Drug use: Yes    Types: Marijuana    Comment: Occasional marijuana use   Social History   Social History Narrative   Married.  Lives with [place].  They have no kids.   Long-term smoker 2 packs a day for 37 years and drinks 20 beers a week.   She is close personal friends with Loney Hering, RN (formerly from [employer]), and Luna Kitchens also from [facility]      Had previously been very active, but since she started working from home has become more more sedentary.  Now is essentially a "couch potato".      She purchased a treadmill, and would like to start working on it, but is concerned now about her coronary calcium score.    OBJCTIVE -PE, EKG, labs   Wt Readings from Last 3 Encounters:  04/06/22 114 lb 9.6 oz (52 kg)  09/29/21 120 lb 12.8 oz (54.8 kg)  08/14/21 120 lb 12.8 oz (54.8 kg)    Physical Exam: BP 132/76   Pulse 79   Ht 5\' 2"  (1.575 m)   Wt 114 lb 9.6 oz (52 kg)   LMP 07/03/2013   SpO2 98%   BMI 20.96 kg/m  Physical Exam   Adult ECG Report  Rate: 79;  Rhythm: normal sinus rhythm, premature atrial contractions (PAC), and ~incomplete RBBB.  Otherwise normal axis, intervals and durations. ;   Narrative Interpretation:  Stable  Recent Labs:  reviewed  Lab Results  Component Value Date   CHOL 183 04/02/2022   HDL 99 04/02/2022   LDLCALC 68 04/02/2022   TRIG 89 04/02/2022   CHOLHDL 1.8 04/02/2022   Lab Results  Component Value Date   CREATININE 0.58 04/02/2022   BUN 3 (L) 04/02/2022   NA 137 04/02/2022   K 3.8 04/02/2022   CL 99 04/02/2022   CO2 22 04/02/2022       No data to display          No results found for: "HGBA1C" No results found for: "TSH"  ==================================================  COVID-19 Education: The signs and symptoms of COVID-19 were discussed with the patient and how to seek care for testing (follow up with PCP or arrange E-visit).    I spent a total of 22 minutes with the patient spent in direct patient consultation.  Additional time spent with chart review  / charting (studies, outside notes, etc): 14 min Total Time: 36 min  Current medicines are reviewed at length with the patient today.  (+/- concerns) N/A  This visit occurred during the SARS-CoV-2 public health emergency.  Safety protocols were in place, including screening questions prior to the visit, additional usage of staff PPE, and extensive cleaning of exam room while observing appropriate contact time as indicated for disinfecting solutions.  Notice: This dictation was prepared with Dragon dictation along with smart phrase technology. Any transcriptional errors that result from this process are unintentional and may not be corrected upon review.  Studies Ordered:   Orders Placed This Encounter  Procedures   EKG 12-Lead   Meds ordered this encounter  Medications  amLODipine (NORVASC) 5 MG tablet    Sig: Take 1 tablet (5 mg total) by mouth daily.    Dispense:  90 tablet    Refill:  3    Discontinue 10 mg dose    Patient Instructions / Medication Changes & Studies & Tests Ordered   Patient Instructions  Medication Instructions:   Decrease dose of Amlodipine to 5 mg daily   *If  you need a refill on your cardiac medications before your next appointment, please call your pharmacy*   Lab Work: Not needed    Testing/Procedures: Not needed    Follow-Up: At [facility], you and your health needs are our priority.  As part of our continuing mission to provide you with exceptional heart care, we have created designated Provider Care Teams.  These Care Teams include your primary Cardiologist (physician) and Advanced Practice Providers (APPs -  Physician Assistants and Nurse Practitioners) who all work together to provide you with the care you need, when you need it.     Your next appointment:   6 month(s)  The format for your next appointment:   In Person  Provider:   Bryan Lemmaavid Herley Bernardini, MD    Other Instructions  Keep a  record of blood pressure - in a journal      Bryan Lemmaavid Autumn Pruitt, M.D., M.S. Interventional Cardiologist   Pager # [phone] Phone # [phone] [address] [place], [place] [address]   Thank you for choosing [facility] at [facility]!!

## 2022-04-17 ENCOUNTER — Other Ambulatory Visit: Payer: Self-pay | Admitting: Family Medicine

## 2022-04-17 DIAGNOSIS — Z1231 Encounter for screening mammogram for malignant neoplasm of breast: Secondary | ICD-10-CM

## 2022-04-23 ENCOUNTER — Ambulatory Visit
Admission: RE | Admit: 2022-04-23 | Discharge: 2022-04-23 | Disposition: A | Discharge status: Home / Self Care | Payer: BC Managed Care – PPO | Source: Ambulatory Visit | Attending: Family Medicine | Admitting: Family Medicine

## 2022-04-23 DIAGNOSIS — Z1231 Encounter for screening mammogram for malignant neoplasm of breast: Secondary | ICD-10-CM | POA: Diagnosis not present

## 2022-04-27 ENCOUNTER — Other Ambulatory Visit: Payer: Self-pay | Admitting: Family Medicine

## 2022-04-27 DIAGNOSIS — R928 Other abnormal and inconclusive findings on diagnostic imaging of breast: Secondary | ICD-10-CM

## 2022-05-04 DIAGNOSIS — K644 Residual hemorrhoidal skin tags: Secondary | ICD-10-CM | POA: Diagnosis not present

## 2022-05-05 ENCOUNTER — Ambulatory Visit
Admission: RE | Admit: 2022-05-05 | Discharge: 2022-05-05 | Disposition: A | Discharge status: Home / Self Care | Payer: BC Managed Care – PPO | Source: Ambulatory Visit | Attending: Family Medicine | Admitting: Family Medicine

## 2022-05-05 ENCOUNTER — Ambulatory Visit: Payer: BC Managed Care – PPO

## 2022-05-05 DIAGNOSIS — R928 Other abnormal and inconclusive findings on diagnostic imaging of breast: Secondary | ICD-10-CM

## 2022-05-16 ENCOUNTER — Encounter: Payer: Self-pay | Admitting: Cardiology

## 2022-05-16 NOTE — Assessment & Plan Note (Signed)
Likely moderate disease in the RCA as well as clearly disease in the left main.  No flow-limiting lesions noted on FFRCT.  Plan: Aggressive risk factor modification  Targeting LDL less than 70, at that goal currently on rosuvastatin.  BP control using beta-blocker as well as calcium channel blocker bradycardia anginal benefit, may need to consider ARB +/- spironolactone.  (She has an allergy to amlodipine listed, but is on amlodipine and tolerating it okay, with exception of edema.) => Reducing dose of amlodipine but thoughts potentially challenging with newer generation ARB.  Continue aspirin for prophylaxis.-Okay to hold 5 to 7 days preop surgery.

## 2022-05-16 NOTE — Assessment & Plan Note (Signed)
Seems to be stable.  Certainly not worse.I suspect this is probably more more related to smoking history than CAD.

## 2022-05-16 NOTE — Assessment & Plan Note (Signed)
With possible left main calcification and minimize disease, we are targeting LDL less than 70. Her LDL has gone down from 80-68 from November 2022 25 Mar 2022.  She seems to be tolerating her modest dose rosuvastatin and is doing a great job.  At target.  Other labs look great.

## 2022-05-16 NOTE — Assessment & Plan Note (Signed)
Still painful.  Plan for discussion with Dr. Maisie Fus is expectant management with stool bulking/softening and working on tendency to push.  Hoping to avoid constipation as well.

## 2022-05-16 NOTE — Assessment & Plan Note (Signed)
Blood pressure looks pretty well controlled today.  She does have a history of situational hypertension and she is somewhat upset and distressed of late with her mother's passing. She is having edema which seems to correlate with increasing up her amlodipine dose.  Plan: We will reduce to 5 mg amlodipine, and may need to consider attempted newer generation ARB +/- diuretic, if better blood control is required.Marland Kitchen

## 2022-05-16 NOTE — Assessment & Plan Note (Signed)
She is working on quitting but still not to where we need to be.  Some in the watchful waiting stage.  We discussed the need for smoking cessation, but she is still not ready.  Says at present until she gets over her mother's death, she would not be able to it. <2 minutes.

## 2022-07-08 DIAGNOSIS — H04123 Dry eye syndrome of bilateral lacrimal glands: Secondary | ICD-10-CM | POA: Diagnosis not present

## 2022-07-08 DIAGNOSIS — H5203 Hypermetropia, bilateral: Secondary | ICD-10-CM | POA: Diagnosis not present

## 2022-07-08 DIAGNOSIS — H2513 Age-related nuclear cataract, bilateral: Secondary | ICD-10-CM | POA: Diagnosis not present

## 2022-07-18 ENCOUNTER — Other Ambulatory Visit: Payer: Self-pay | Admitting: Cardiology

## 2022-07-28 DIAGNOSIS — Z79899 Other long term (current) drug therapy: Secondary | ICD-10-CM | POA: Diagnosis not present

## 2022-07-28 DIAGNOSIS — E7800 Pure hypercholesterolemia, unspecified: Secondary | ICD-10-CM | POA: Diagnosis not present

## 2022-07-28 DIAGNOSIS — R7301 Impaired fasting glucose: Secondary | ICD-10-CM | POA: Diagnosis not present

## 2022-07-28 DIAGNOSIS — F17210 Nicotine dependence, cigarettes, uncomplicated: Secondary | ICD-10-CM | POA: Diagnosis not present

## 2022-07-28 DIAGNOSIS — E559 Vitamin D deficiency, unspecified: Secondary | ICD-10-CM | POA: Diagnosis not present

## 2022-07-28 DIAGNOSIS — Z0000 Encounter for general adult medical examination without abnormal findings: Secondary | ICD-10-CM | POA: Diagnosis not present

## 2022-10-05 ENCOUNTER — Ambulatory Visit: Payer: BC Managed Care – PPO | Attending: Cardiology | Admitting: Cardiology

## 2022-10-05 ENCOUNTER — Encounter: Payer: Self-pay | Admitting: Cardiology

## 2022-10-05 VITALS — BP 142/80 | HR 78 | Ht 62.0 in | Wt 116.4 lb

## 2022-10-05 DIAGNOSIS — Z01810 Encounter for preprocedural cardiovascular examination: Secondary | ICD-10-CM

## 2022-10-05 DIAGNOSIS — Z720 Tobacco use: Secondary | ICD-10-CM

## 2022-10-05 DIAGNOSIS — E785 Hyperlipidemia, unspecified: Secondary | ICD-10-CM

## 2022-10-05 DIAGNOSIS — I2510 Atherosclerotic heart disease of native coronary artery without angina pectoris: Secondary | ICD-10-CM

## 2022-10-05 DIAGNOSIS — R0609 Other forms of dyspnea: Secondary | ICD-10-CM

## 2022-10-05 DIAGNOSIS — I10 Essential (primary) hypertension: Secondary | ICD-10-CM

## 2022-10-05 DIAGNOSIS — R42 Dizziness and giddiness: Secondary | ICD-10-CM

## 2022-10-05 DIAGNOSIS — R55 Syncope and collapse: Secondary | ICD-10-CM

## 2022-10-05 NOTE — Progress Notes (Signed)
Primary Care Provider: Mila Palmer, MD [facility] Cardiologist: Bryan Lemma, MD Electrophysiologist: None  Clinic Note: Chief Complaint  Patient presents with   Follow-up    42-month   ===================================  ASSESSMENT/PLAN   Problem List Items Addressed This Visit       Cardiology Problems   Hyperlipidemia with target LDL less than 70 (Chronic)   Relevant Medications   amLODipine (NORVASC) 10 MG tablet   Other Relevant Orders   Lipid panel   Comprehensive metabolic panel   Coronary artery disease, non-occlusive by coronary CTA (CT FFR negative for LM, RCA, LAD and LCx) - Primary (Chronic)    Thankfully, she is not having any issues at all with chest pain or pressure.  CT scan had some lesions but not flow-limiting by coronary CT FFR.  Borderline BP on increased dose of amlodipine 10 mg daily and Bystolic 20 mg daily. ->   Targets for LDL less than 70-currently at goal on rosuvastatin      Relevant Medications   amLODipine (NORVASC) 10 MG tablet   Other Relevant Orders   Lipid panel   Comprehensive metabolic panel   Essential hypertension (Chronic)    Borderline BP today.  He does have a history of situational hypertension pretty well-controlled with Bystolic.  Her original dose of amlodipine dose was increased to 10 mg recently.  Hold off on adjusting for now => low threshold to add a low-dose diuretic such as HCTZ or chlorthalidone.      Relevant Medications   amLODipine (NORVASC) 10 MG tablet   Other Relevant Orders   Comprehensive metabolic panel   Postural dizziness with near syncope    We have been leery of being overly aggressive with hypertension treatment and therefore I am holding off on adjusting further, but stressed the importance of adequate hydration and we will avoid a true diuretic.      Relevant Medications   amLODipine (NORVASC) 10 MG tablet     Other   Preop cardiovascular exam (Chronic)   Tobacco abuse  (Chronic)    Counseled.  Taking on advisement.  Hoping to quit but still not able to make through.      DOE (dyspnea on exertion) (Chronic)    Still has dyspnea, but not likely to be from cardiac issues.  She did have any symptoms of heart failure.  Continue to monitor.  Keep giving blood pressure slightly better controlled with help       ===================================  HPI:    Jeanne Harris is a 56 y.o. female with a PMH below who presents today for 70-month follow-up of NONOBSTRUCTIVE CAD  at the request of Mila Palmer, MD.  Pertinent PMH PMH: Long-Term Tobacco Use-with COPD,  Hypertension,  SIGNIFICANT HYPERLIPIDEMIA a FAMILY HISTORY of PREMATURE CAD with Borderline/Not Critical Abnormal CORONARY CT ANGIOGRAM  Prolapse Hemorrhoids  Jeanne Harris was last seen on Apr 06, 2022 as a 45-month follow-up.  She was doing well.  No issues with current standpoint.  Still sad about her mother passing away 63.  Having nightmares and poor sleep.  Lots of anxiety.  Mostly musculoskeletal chest pain off and on.  Recent Hospitalizations: none  Reviewed  CV studies:    The following studies were reviewed today: (if available, images/films reviewed: From Epic Chart or Care Everywhere) none:  Interval History:   JANALYNN BACHELLER returns for routine follow-up.  She knowledges that for the last 6 months so she has been pretty sedentary has not really  done much at all.  She has been trying to work on getting out to do some walking, but this has not been all that routine with it.  With what she does do, she denies any chest pain or pressure with rest or exertion.  Maybe a little bit of exercise intolerance and dyspnea if she pushes too hard but not out of the ordinary.  She has some off-and-on fluttering sensations lasting a few seconds here and there without any real trigger.  Occasional musculoskeletal discomfort in the left upper chest and back that can take her breath away but  this is also fleeting.  1 episode of near syncope back in September when she felt dizzy lightheaded and dizzy, but suspects that she was not really feeling well and was little dehydrated.  This happens on occasion in similar situations.  No loss of consciousness.  CV Review of Symptoms (Summary): Cardiovascular ROS: positive for - dyspnea on exertion, palpitations, and lightheadedness and dizziness/near syncope with no true syncope. negative for - chest pain, edema, orthopnea, paroxysmal nocturnal dyspnea, rapid heart rate, shortness of breath, or syncope/near syncope or TIA/amaurosis fugax, claudication  REVIEWED OF SYSTEMS   Review of Systems  Constitutional:  Positive for malaise/fatigue (More exercise intolerance due to send behavior and he can stand.). Negative for weight loss.  HENT:  Negative for congestion and nosebleeds.   Respiratory:  Positive for cough and shortness of breath.        No change in baseline  Cardiovascular:        Per HPI  Gastrointestinal:  Positive for constipation. Negative for blood in stool (Not recently.) and melena.  Genitourinary:  Negative for hematuria.  Musculoskeletal:  Positive for joint pain (Mild aches and pains).  Neurological:  Negative for dizziness and focal weakness.  Psychiatric/Behavioral:  Negative for depression (Maybe little dysthymia.). The patient is not nervous/anxious and does not have insomnia.    I have reviewed and (if needed) personally updated the patient's problem list, medications, allergies, past medical and surgical history, social and family history.   PAST MEDICAL HISTORY   Past Medical History:  Diagnosis Date   Alcohol abuse, daily use    Chest pain    Chronic bronchitis    COPD (chronic obstructive pulmonary disease) (HCC)    pt states diagnosed by Dr. Inda Merlin   Coronary artery disease involving native coronary artery of native heart without angina pectoris 08/05/2021   Coronary CTA: Ost LM - Focal protruding nonCa+  plaque (not dissection, FFR 0.99).  Cannot r/o ulcerated plaque.  Nl caliber LAD w/ mixed cal/noncal plaque (1-24% prox).  LCx-large OM1 = prox LCx (1-24%).;  Prox RCA 50 to 69% mixed cal/noncal plaque.  Focal mixed 50-69% mid to dis RCA (high risk features).  Diffuse mixed 50-69 % dRCA => (FFR 0.98-0.93-.09) -- NOT SIGNIFICANT;   Diverticulitis    History of hypertension    Hyperlipidemia with target LDL less than 70 12/15/2011   Prolapsed hemorrhoids    SOB (shortness of breath)    when having chest pain   Tobacco abuse     PAST SURGICAL HISTORY   Past Surgical History:  Procedure Laterality Date   CARDIAC CTA  08/05/2021   LM: Focal protruding noncalcified plaque in the ost LM-no extravasation to suggest dissection (FFR 0.99).  Cannot r/o ulcerated plaque.  Nl caliber LAD w/ mixed cal/noncal plaque (1-24% prox).  LCx-large OM1 = prox LCx (1-24%).;  Prox RCA 50 to 69% mixed cal/noncal plaque.  Focal  mixed 50-69% mid to dis RCA (high risk features).  Diffuse mixed 50-69 % dRCA => (FFR 0.98-0.93-.09) -- NOT SIGNIFICANT   CORONARY CALCIUM SCORE  07/15/2021   (Agatston) Score: 358, left main 78, LAD 15, RCA 254 and LCx 10.1.  Also noted aortic atherosclerosis   DOBUTAMINE STRESS ECHO  10/2012   EKG negative for ischemia (reached 93% maximum heart rate).  Echo negative for ischemia.  Baseline echo with normal EF and normal wall motion.   none      Immunization History  Administered Date(s) Administered   Moderna Sars-Covid-2 Vaccination 01/25/2020, 02/22/2020, 10/11/2020    MEDICATIONS/ALLERGIES   Current Meds  Medication Sig   amLODipine (NORVASC) 10 MG tablet Take 10 mg by mouth daily.   aspirin EC 81 MG tablet Take 1 tablet (81 mg total) by mouth daily. Swallow whole.   BYSTOLIC 20 MG TABS every evening.   hydrocortisone cream 1 % Apply topically.   Ibuprofen (ADVIL PO) Take by mouth as needed.   rosuvastatin (CRESTOR) 20 MG tablet TAKE 1 TABLET BY MOUTH EVERYDAY AT BEDTIME     Allergies  Allergen Reactions   Hydrochlorothiazide     Dizziness   Lisinopril Cough   Norvasc [Amlodipine] Other (See Comments)    Edema   Other     Nitrous Oxide   Oyster Shell Nausea And Vomiting    SOCIAL HISTORY/FAMILY HISTORY   Reviewed in Epic:  Pertinent findings:  Social History   Tobacco Use   Smoking status: Every Day    Packs/day: 2.00    Years: 37.00    Total pack years: 74.00    Types: Cigarettes   Smokeless tobacco: Never  Vaping Use   Vaping Use: Former  Substance Use Topics   Alcohol use: Yes    Alcohol/week: 20.0 standard drinks of alcohol    Types: 20 Cans of beer per week   Drug use: Yes    Types: Marijuana    Comment: Occasional marijuana use   Social History   Social History Narrative   Married.  Lives with [place].  They have no kids.   Long-term smoker 2 packs a day for 37 years and drinks 20 beers a week.   She is close personal friends with Loney Hering, RN (formerly from [employer]), and Luna Kitchens also from [facility]      Had previously been very active, but since she started working from home has become more more sedentary.  Now is essentially a "couch potato".      She purchased a treadmill, and would like to start working on it, but is concerned now about her coronary calcium score.    OBJCTIVE -PE, EKG, labs   Wt Readings from Last 3 Encounters:  10/05/22 116 lb 6.4 oz (52.8 kg)  04/06/22 114 lb 9.6 oz (52 kg)  09/29/21 120 lb 12.8 oz (54.8 kg)    Physical Exam: BP (!) 142/80 (BP Location: Left Arm, Patient Position: Sitting, Cuff Size: Normal)   Pulse 78   Ht 5\' 2"  (1.575 m)   Wt 116 lb 6.4 oz (52.8 kg)   LMP 07/03/2013   SpO2 95%   BMI 21.29 kg/m  Physical Exam Vitals reviewed.  Constitutional:      General: She is not in acute distress.    Appearance: Normal appearance. She is normal weight. She is not ill-appearing (Healthy-appearing.  Well-nourished and well-groomed.) or  toxic-appearing.  HENT:     Head: Normocephalic and atraumatic.  Neck:     Vascular: Carotid bruit present.  Cardiovascular:     Rate and Rhythm: Normal rate and regular rhythm. Occasional Extrasystoles are present.    Chest Wall: PMI is not displaced.     Pulses: Normal pulses.     Heart sounds: Normal heart sounds.  Pulmonary:     Effort: Pulmonary effort is normal.     Breath sounds: No wheezing, rhonchi (Mild diffuse crackles but no obvious rales or rhonchi.) or rales.  Chest:     Chest wall: No tenderness.  Musculoskeletal:     Cervical back: Normal range of motion and neck supple.  Skin:    General: Skin is warm and dry.  Neurological:     General: No focal deficit present.     Mental Status: She is alert and oriented to person, place, and time.     Gait: Gait normal.  Psychiatric:        Mood and Affect: Mood normal.        Behavior: Behavior normal.        Thought Content: Thought content normal.        Judgment: Judgment normal.     Adult ECG Report N/a  Recent Labs: Reviewed Lab Results  Component Value Date   CHOL 183 04/02/2022   HDL 99 04/02/2022   LDLCALC 68 04/02/2022   TRIG 89 04/02/2022   CHOLHDL 1.8 04/02/2022   Lab Results  Component Value Date   CREATININE 0.58 04/02/2022   BUN 3 (L) 04/02/2022   NA 137 04/02/2022   K 3.8 04/02/2022   CL 99 04/02/2022   CO2 22 04/02/2022       No data to display          No results found for: "HGBA1C" No results found for: "TSH"  ================================================== I spent a total of 25 minutes with the patient spent in direct patient consultation.  Additional time spent with chart review  / charting (studies, outside notes, etc): 20 min Total Time: 45 min  Current medicines are reviewed at length with the patient today.  (+/- concerns) N/A  Notice: This dictation was prepared with Dragon dictation along with smart phrase technology. Any transcriptional errors that result from this  process are unintentional and may not be corrected upon review.  Studies Ordered:   Orders Placed This Encounter  Procedures   Lipid panel   Comprehensive metabolic panel   No orders of the defined types were placed in this encounter.   Patient Instructions / Medication Changes & Studies & Tests Ordered   Patient Instructions  Medication Instructions:   No changes *If you need a refill on your cardiac medications before your next appointment, please call your pharmacy*   Lab Work: March 2023 Lipid CMP If you have labs (blood work) drawn today and your tests are completely normal, you will receive your results only by: [place] (if you have MyChart) OR A paper copy in the mail If you have any lab test that is abnormal or we need to change your treatment, we will call you to review the results.   Testing/Procedures: Not needed   Follow-Up: At [facility], you and your health needs are our priority.  As part of our continuing mission to provide you with exceptional heart care, we have created designated Provider Care Teams.  These Care Teams include your primary Cardiologist (physician) and Advanced Practice Providers (APPs -  Physician Assistants and Nurse Practitioners) who all work together to provide  you with the care you need, when you need it.     Your next appointment:   5 to 6 month(s) April or May 2024   The format for your next appointment:   In Person  Provider:   Glenetta Hew, MD    Other Instructions  Hyrdate ,hydrate      Leonie Man, MD, MS Glenetta Hew, M.D., M.S. Interventional Cardiologist  [place]  Pager # [phone] Phone # [phone] [address]. [place], [place] [address]   Thank you for choosing [facility]!!

## 2022-10-05 NOTE — Patient Instructions (Addendum)
Medication Instructions:   No changes *If you need a refill on your cardiac medications before your next appointment, please call your pharmacy*   Lab Work: March 2023 Lipid CMP If you have labs (blood work) drawn today and your tests are completely normal, you will receive your results only by: MyChart Message (if you have MyChart) OR A paper copy in the mail If you have any lab test that is abnormal or we need to change your treatment, we will call you to review the results.   Testing/Procedures: Not needed   Follow-Up: At [facility] you and your health needs are our priority.  As part of our continuing mission to provide you with exceptional heart care, we have created designated Provider Care Teams.  These Care Teams include your primary Cardiologist (physician) and Advanced Practice Providers (APPs -  Physician Assistants and Nurse Practitioners) who all work together to provide you with the care you need, when you need it.     Your next appointment:   5 to 6 month(s) April or May 2024   The format for your next appointment:   In Person  Provider:   Bryan Lemma, MD    Other Instructions  Hyrdate ,hydrate

## 2022-10-31 ENCOUNTER — Encounter: Payer: Self-pay | Admitting: Cardiology

## 2022-10-31 NOTE — Assessment & Plan Note (Signed)
We have been leery of being overly aggressive with hypertension treatment and therefore I am holding off on adjusting further, but stressed the importance of adequate hydration and we will avoid a true diuretic.

## 2022-10-31 NOTE — Assessment & Plan Note (Signed)
Borderline BP today.  He does have a history of situational hypertension pretty well-controlled with Bystolic.  Her original dose of amlodipine dose was increased to 10 mg recently.  Hold off on adjusting for now => low threshold to add a low-dose diuretic such as HCTZ or chlorthalidone.

## 2022-10-31 NOTE — Assessment & Plan Note (Addendum)
Thankfully, she is not having any issues at all with chest pain or pressure.  CT scan had some lesions but not flow-limiting by coronary CT FFR.  Borderline BP on increased dose of amlodipine 10 mg daily and Bystolic 20 mg daily. ->   Targets for LDL less than 70-currently at goal on rosuvastatin

## 2022-10-31 NOTE — Assessment & Plan Note (Signed)
Still has dyspnea, but not likely to be from cardiac issues.  She did have any symptoms of heart failure.  Continue to monitor.  Keep giving blood pressure slightly better controlled with help

## 2022-10-31 NOTE — Assessment & Plan Note (Signed)
Counseled.  Taking on advisement.  Hoping to quit but still not able to make through.

## 2023-03-08 ENCOUNTER — Ambulatory Visit: Payer: BC Managed Care – PPO | Admitting: Cardiology

## 2023-03-16 DIAGNOSIS — K644 Residual hemorrhoidal skin tags: Secondary | ICD-10-CM | POA: Diagnosis not present

## 2023-03-27 IMAGING — MG MM DIGITAL DIAGNOSTIC UNILAT*L* W/ TOMO W/ CAD
4 series · 4 of 12 positions shown · non-contrast
Comparison: Previous exam(s).

CLINICAL DATA: Patient recalled from screening for left breast
asymmetry.

EXAM:
DIGITAL DIAGNOSTIC UNILATERAL LEFT MAMMOGRAM WITH TOMOSYNTHESIS AND
CAD
TECHNIQUE: Left digital diagnostic mammography and breast tomosynthesis was
performed. The images were evaluated with computer-aided detection.

[L MLO synth-2D]
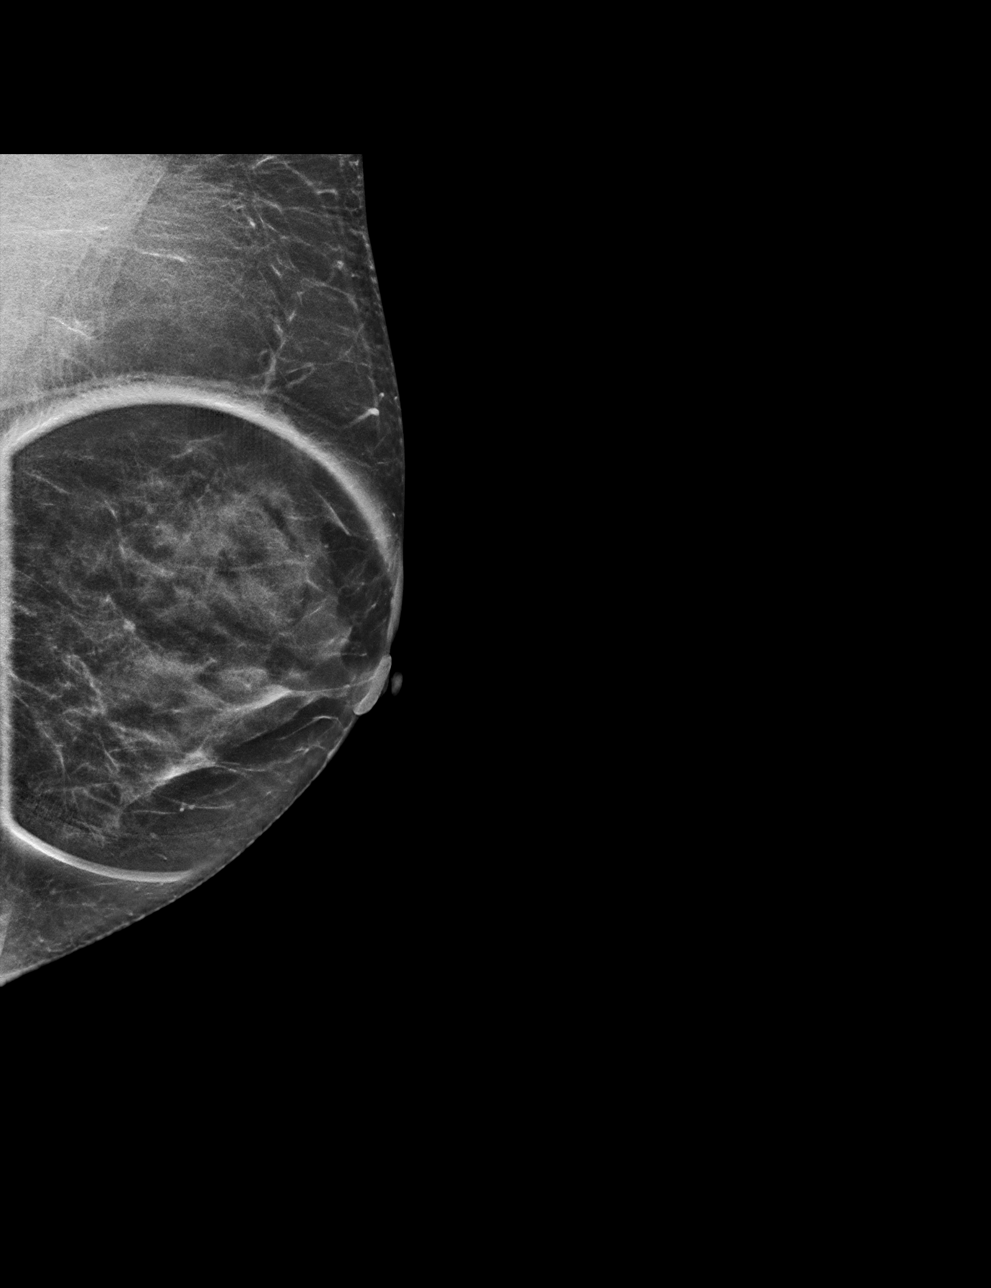

[L CC synth-2D]
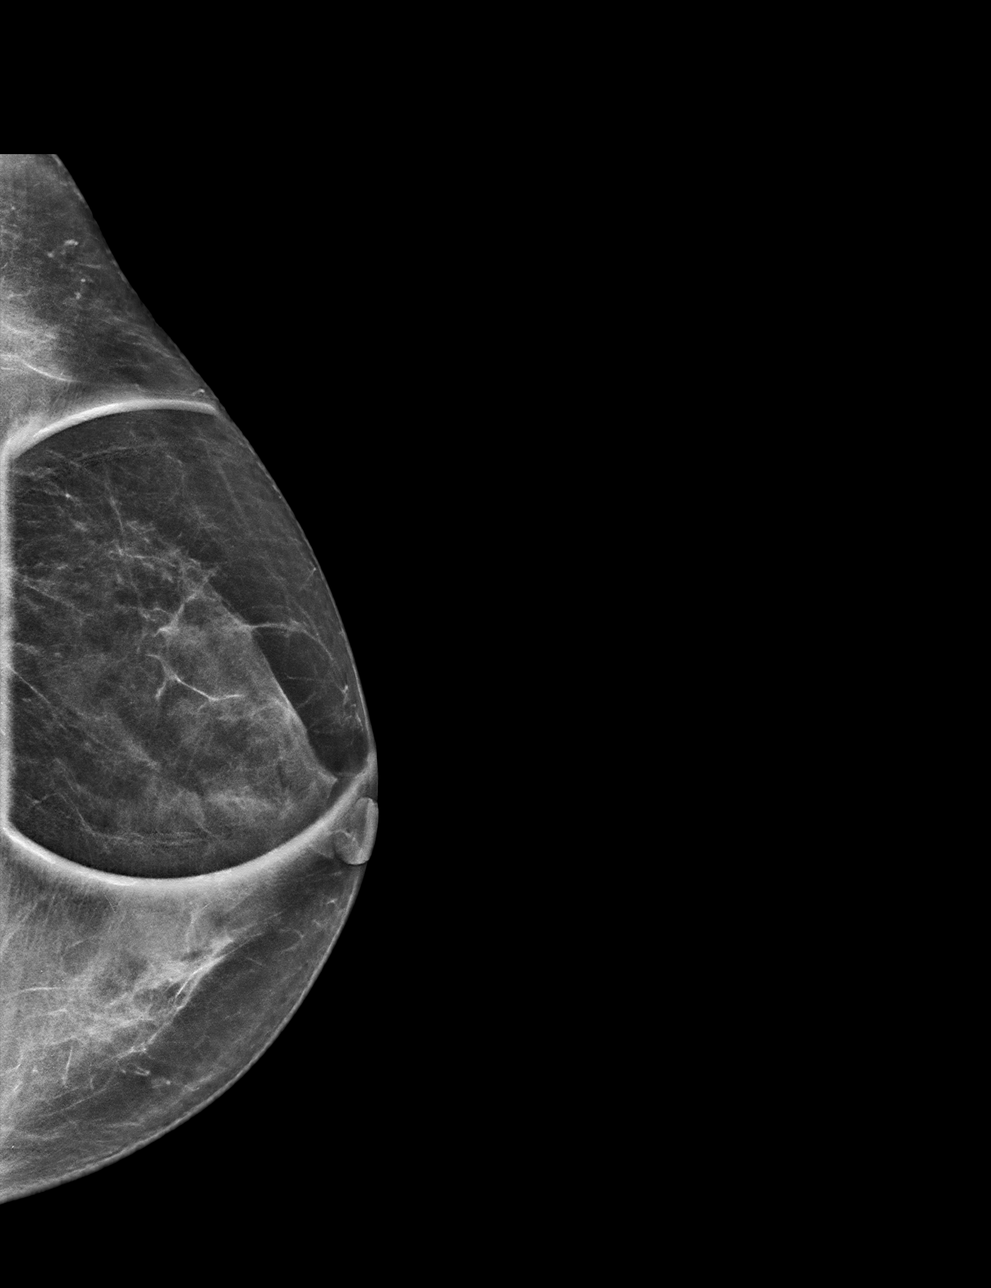

[L CC tomo · tomo slice 27/54.0]
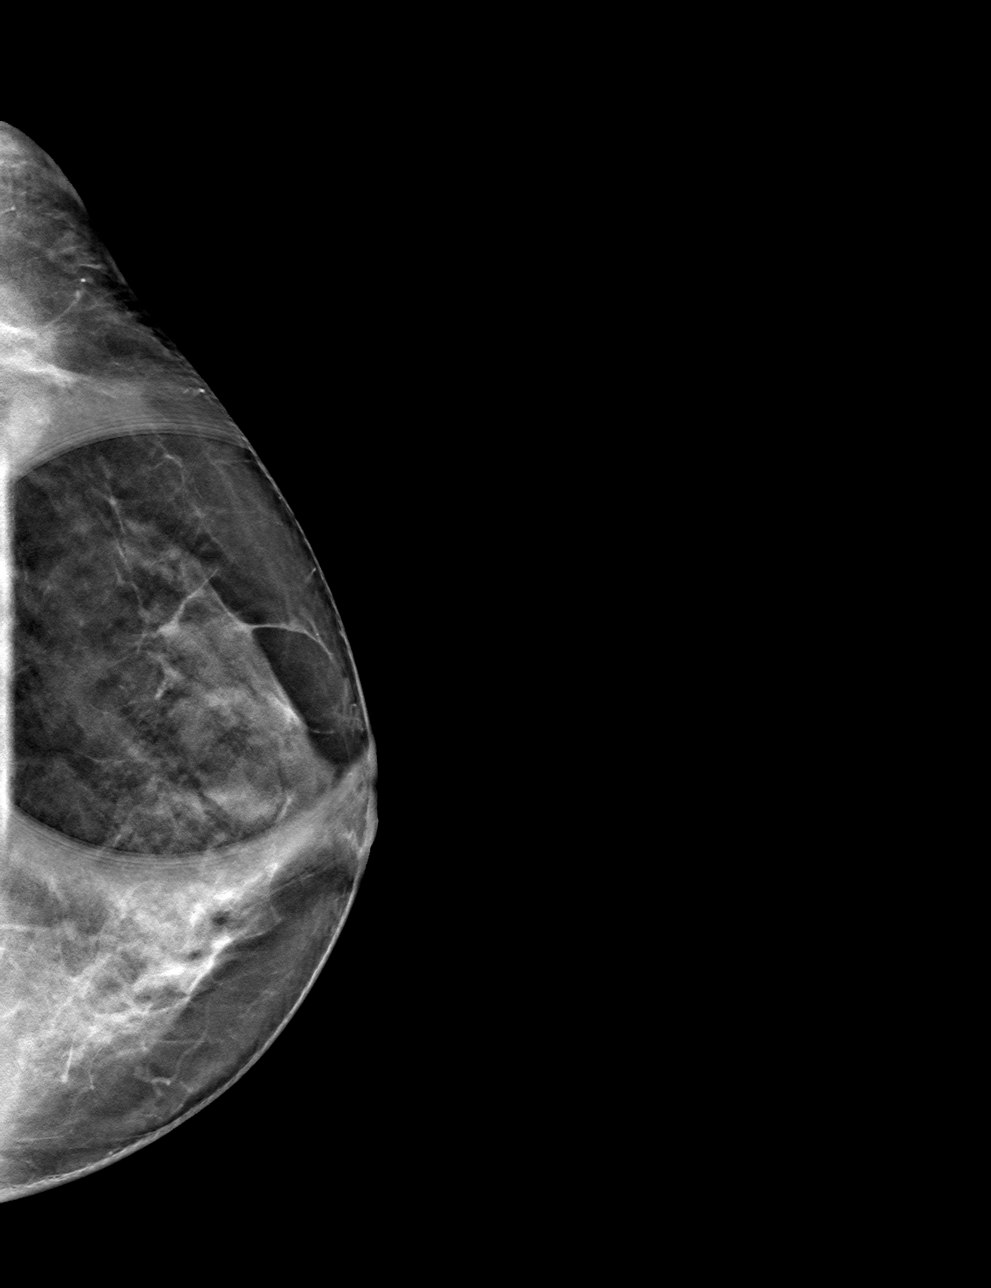

[L MLO tomo · tomo slice 27/52.0]
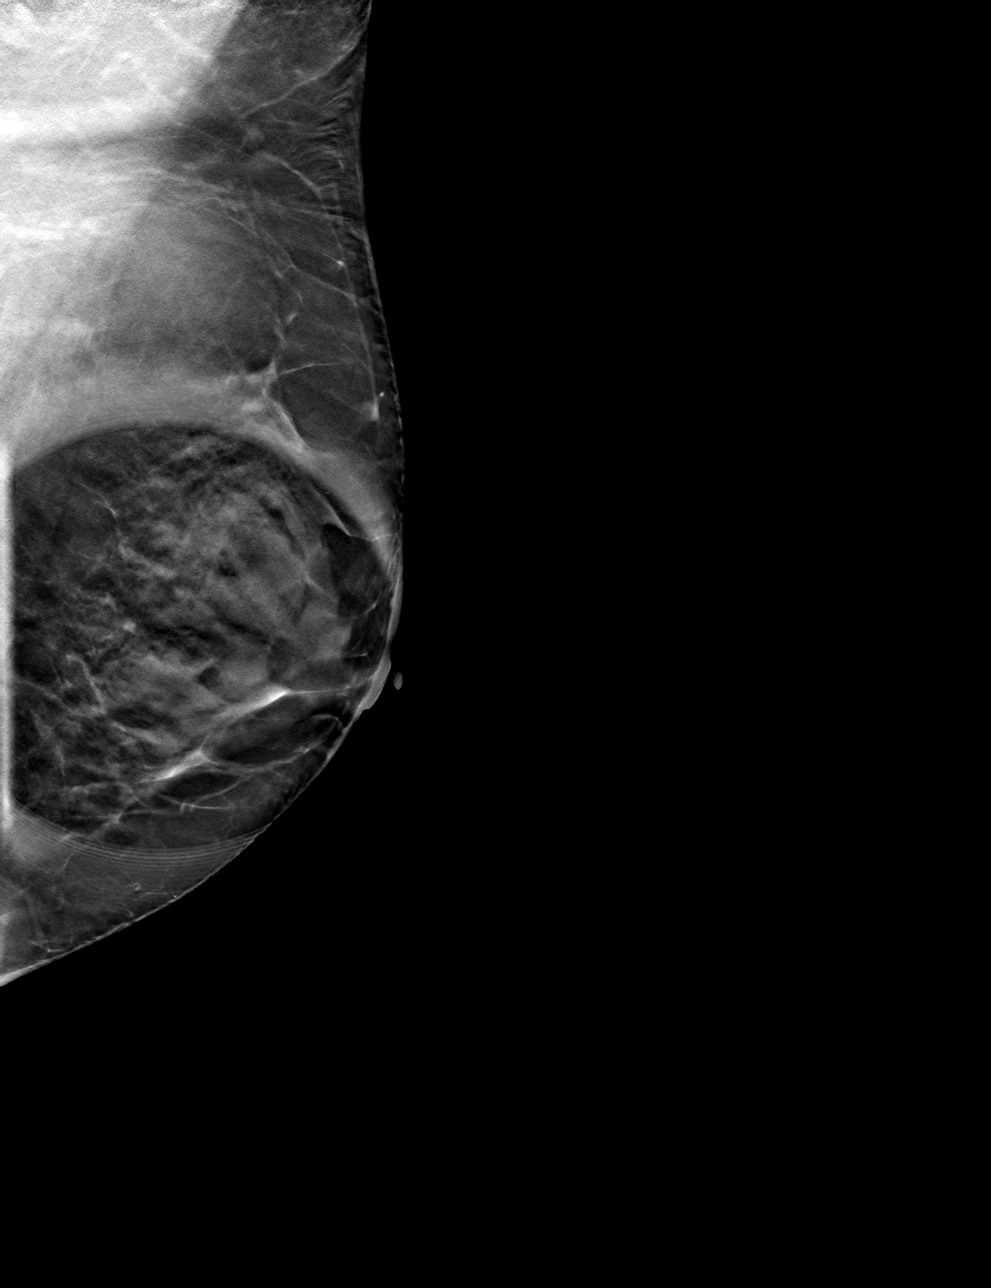

[4 of 12 positions shown; findings below may reference images not displayed]

ACR Breast Density Category c: The breast tissue is heterogeneously
dense, which may obscure small masses.
FINDINGS: Questioned asymmetry within the left breast resolved with additional
imaging compatible with dense overlapping fibroglandular tissue. No
suspicious findings on additional imaging.
IMPRESSION: No mammographic evidence for malignancy.

RECOMMENDATION:
Screening mammogram in one year.(Code:[id number])

I have discussed the findings and recommendations with the patient.
If applicable, a reminder letter will be sent to the patient
regarding the next appointment.

BI-RADS CATEGORY  1: Negative.

## 2023-04-08 ENCOUNTER — Other Ambulatory Visit: Payer: Self-pay | Admitting: Family Medicine

## 2023-04-08 DIAGNOSIS — Z0000 Encounter for general adult medical examination without abnormal findings: Secondary | ICD-10-CM

## 2023-04-28 ENCOUNTER — Ambulatory Visit
Admission: RE | Admit: 2023-04-28 | Discharge: 2023-04-28 | Disposition: A | Discharge status: Home / Self Care | Payer: BC Managed Care – PPO | Source: Ambulatory Visit | Attending: Family Medicine | Admitting: Family Medicine

## 2023-04-28 DIAGNOSIS — Z0000 Encounter for general adult medical examination without abnormal findings: Secondary | ICD-10-CM

## 2023-04-28 DIAGNOSIS — Z1231 Encounter for screening mammogram for malignant neoplasm of breast: Secondary | ICD-10-CM | POA: Diagnosis not present

## 2023-05-10 ENCOUNTER — Telehealth: Payer: Self-pay | Admitting: Cardiology

## 2023-05-10 MED ORDER — ROSUVASTATIN CALCIUM 20 MG PO TABS: ORAL_TABLET | ORAL | 1 refills | Status: DC

## 2023-05-10 NOTE — Telephone Encounter (Signed)
*  STAT* If patient is at the pharmacy, call can be transferred to refill team.   1. Which medications need to be refilled? (please list name of each medication and dose if known) rosuvastatin (CRESTOR) 20 MG tablet   2. Which pharmacy/location (including street and city if local pharmacy) is medication to be sent to?  [id number] - [place], [place] - [address]    3. Do they need a 30 day or 90 day supply? 90

## 2023-05-10 NOTE — Telephone Encounter (Signed)
Pt's medication was sent to pt's pharmacy as requested. Confirmation received.  °

## 2023-07-30 DIAGNOSIS — E7800 Pure hypercholesterolemia, unspecified: Secondary | ICD-10-CM | POA: Diagnosis not present

## 2023-07-30 DIAGNOSIS — R7301 Impaired fasting glucose: Secondary | ICD-10-CM | POA: Diagnosis not present

## 2023-07-30 DIAGNOSIS — Z79899 Other long term (current) drug therapy: Secondary | ICD-10-CM | POA: Diagnosis not present

## 2023-07-30 DIAGNOSIS — E559 Vitamin D deficiency, unspecified: Secondary | ICD-10-CM | POA: Diagnosis not present

## 2023-07-30 DIAGNOSIS — Z0000 Encounter for general adult medical examination without abnormal findings: Secondary | ICD-10-CM | POA: Diagnosis not present

## 2023-07-30 LAB — LAB REPORT - SCANNED
A1c: 5.5
EGFR: 103

## 2023-09-03 ENCOUNTER — Encounter: Payer: Self-pay | Admitting: Cardiology

## 2023-09-03 ENCOUNTER — Ambulatory Visit: Payer: BC Managed Care – PPO | Attending: Cardiology | Admitting: Cardiology

## 2023-09-03 VITALS — BP 138/86 | HR 81 | Ht 62.0 in | Wt 111.8 lb

## 2023-09-03 DIAGNOSIS — F1010 Alcohol abuse, uncomplicated: Secondary | ICD-10-CM

## 2023-09-03 DIAGNOSIS — I10 Essential (primary) hypertension: Secondary | ICD-10-CM

## 2023-09-03 DIAGNOSIS — I2510 Atherosclerotic heart disease of native coronary artery without angina pectoris: Secondary | ICD-10-CM | POA: Diagnosis not present

## 2023-09-03 DIAGNOSIS — R0609 Other forms of dyspnea: Secondary | ICD-10-CM | POA: Diagnosis not present

## 2023-09-03 DIAGNOSIS — Z8249 Family history of ischemic heart disease and other diseases of the circulatory system: Secondary | ICD-10-CM

## 2023-09-03 DIAGNOSIS — R252 Cramp and spasm: Secondary | ICD-10-CM

## 2023-09-03 DIAGNOSIS — E785 Hyperlipidemia, unspecified: Secondary | ICD-10-CM

## 2023-09-03 DIAGNOSIS — Z720 Tobacco use: Secondary | ICD-10-CM

## 2023-09-03 NOTE — Progress Notes (Signed)
Cardiology Office Note:  .   Date:  09/03/2023  ID:  Jeanne Harris, DOB [birth year], MRN [id number] PCP: Mila Palmer, MD  [facility] Providers Cardiologist:  Bryan Lemma, MD     Chief Complaint  Patient presents with   Follow-up    1 yr   Coronary Artery Disease    Nonobstructive CAD by Coronary CTA, no active angina.   Hypertension    Finally decided to stop amlodipine because of swollen gums.  Did not tolerate chlorthalidone.    Patient Profile: .     Jeanne Harris is a  57 y.o. female long-term smoker with a PMH notable for nonobstructive CAD by Coronary CTA (see below, along with family history of CAD-father died of massive heart attack), HTN, HLD, and COPD who presents here for annual f/u at the request of Mila Palmer, MD.   Jeanne Harris was last seen on October 05, 2022 she noted that she had been relatively sedentary over the last several months and was develops a valve or any major symptoms.  No chest pain or pressure at rest exertion.  Some exercise intolerance with dyspnea if she pushed it too much but was pretty out of shape.  Off-and-on fluttering.  Musculoskeletal chest and back pain claudication.  1 episode of near syncope with lightheadedness and dizziness, likely related to dehydration.  Subjective  Discussed the use of AI scribe software for clinical note transcription with the patient, who gave verbal consent to proceed.  History of Present Illness   The patient, with a history of hypertension, presented with concerns about medication side effects. She reported severe gum swelling and pain while on amlodipine, which led to discontinuation of the medication on the advice of her dentist and periodontist. The patient also reported increased energy and cessation of teeth grinding within five days of stopping amlodipine.  Subsequently, the patient was prescribed a new antihypertensive medication, which she discontinued after eight days due to side  effects including blurry vision, foot and leg cramps, and an upset stomach. The patient expressed reluctance to try new medications due to these adverse reactions.  The patient's blood pressure at home, while only on Bystolic and Crestor, was reported to be around 140/80. She expressed a desire to monitor her blood pressure at home over the next few months before considering additional medication.  The patient also reported a history of a persistent cough while on lisinopril, leading to its discontinuation. She mentioned a history of sensitivity to various antihypertensive medications.  The patient acknowledged a lack of physical activity over the past six months and expressed a desire to start walking regularly. She also reported a smoking habit, which she is contemplating quitting. The patient mentioned occasional leg cramping, which she attributed to lack of mobility. She also reported occasional chest fluttering lasting only seconds.  The patient's cholesterol was reported to be well-controlled on Crestor. She also reported a weight loss, which she attributed to healthier eating habits. The patient expressed a desire to manage her hypertension through lifestyle modifications before considering additional medications.     Cardiovascular ROS: positive for - dyspnea on exertion and this is more c/w deconditioning; still rare fluttering sensations; occasional leg cramps with walking negative for - chest pain, edema, irregular heartbeat, orthopnea, paroxysmal nocturnal dyspnea, rapid heart rate, or syncope/near syncope - since being off of diuretic; TIA/amaurosis fugax/CVA,  ROS:  Review of Systems - Negative except Exercise intolerance.  Muscle aches and pains - cramps while on  chlorthalidone;  Dysthymia - not active    Objective  Studies Reviewed: Marland Kitchen   EKG Interpretation Date/Time:  Friday September 03 2023 08:31:08 EDT Ventricular Rate:  76 PR Interval:  146 QRS Duration:  96 QT  Interval:  426 QTC Calculation: 479 R Axis:   68  Text Interpretation: Normal sinus rhythm Incomplete right bundle branch block No previous ECGs available Confirmed by Bryan Lemma ([id number]) on 09/03/2023 9:20:19 AM   Labs from 07/30/2023: A1c 5.5; WBC 7.7, H/H14.3/42.3, PLT 173; Glu , BUN/Cr 7/0.64.  K+ 3.6.  AST mildly elevated 48 but otherwise normal chemistry panel.  TSH 1.68; TC 175, TG 55, HDL 101, LDL 64.  Coronary artery disease involving native coronary artery of native heart without angina pectoris 08/05/2021    Coronary CTA: Ost LM - Focal protruding nonCa+ plaque (not dissection, FFR 0.99).  Cannot r/o ulcerated plaque.  Nl caliber LAD w/ mixed cal/noncal plaque (1-24% prox).  LCx-large OM1 = prox LCx (1-24%).;  Prox RCA 50 to 69% mixed cal/noncal plaque.  Focal mixed 50-69% mid to dis RCA (high risk features).  Diffuse mixed 50-69 % dRCA => (FFR 0.98-0.93-.09) -- NOT SIGNIFICANT;   Lab Results  Component Value Date   CHOL 183 04/02/2022   HDL 99 04/02/2022   LDLCALC 68 04/02/2022   TRIG 89 04/02/2022   CHOLHDL 1.8 04/02/2022   Lab Results  Component Value Date   NA 137 04/02/2022   K 3.8 04/02/2022   CREATININE 0.58 04/02/2022   EGFR 103.0 07/30/2023   GLUCOSE 87 04/02/2022     Risk Assessment/Calculations:            Physical Exam:   VS:  BP 138/86   Pulse 81   Ht 5\' 2"  (1.575 m)   Wt 111 lb 12.8 oz (50.7 kg)   LMP 07/03/2013   SpO2 98%   BMI 20.45 kg/m    Wt Readings from Last 3 Encounters:  09/03/23 111 lb 12.8 oz (50.7 kg)  10/05/22 116 lb 6.4 oz (52.8 kg)  04/06/22 114 lb 9.6 oz (52 kg)    GEN: healthy appearing; Well nourished, well developed in no acute distress;  NECK: No JVD; No carotid bruits CARDIAC: Normal S1, S2; RRR, no murmurs, rubs, gallops RESPIRATORY: With the exception of late expiratory wheezing mostly in the right lower and middle lung fields, CTAB without rhonchi or rales.; nonlabored, good air movement.  (Notably, she is dealing with  ragweed allergies right now) ABDOMEN: Soft, non-tender, non-distended EXTREMITIES:  No edema; No deformity     ASSESSMENT AND PLAN: .    Problem List Items Addressed This Visit       Cardiology Problems   Coronary artery disease, non-occlusive by coronary CTA (CT FFR negative for LM, RCA, LAD and LCx) - Primary (Chronic)    No active anginal symptoms.  Continue risk factor modification-discussed the importance of smoking cessation and BP control is her major difficulty control options. Discussed importance of cutting back alcohol.  Thankfully, her lipids have been well-controlled with most recent LDL of 64. Patient reports lack of exercise in recent months.  Plan: Still wants to avoid aspirin mostly because of concerns of ongoing hemorrhoidal bleeding. Continue rosuvastatin 20 mg daily.  Tolerating well.  Target LDL<70 if not<55. Unable to tolerate amlodipine, now is only on Bystolic 20 mg.  Would consider spironolactone as a next option although apparently she has had issues with any diuretic. Smoking cessation counseling Encouraged increased activity, specifically walking.  Relevant Orders   EKG 12-Lead (Completed)   Essential hypertension (Chronic)    Discontinued Amlodipine due to gum swelling and pain. Unsuccessful trial of an unidentified diuretic due to side effects including blurry vision, leg cramps, and upset stomach. Current BP 140/80. Patient wishes to monitor BP at home and consider medication changes if necessary. -Continue Bystolic 20mg  daily. -Monitor BP at home, aiming for average readings less than 135/80. -Consider Spironolactone if BP remains elevated.      Hyperlipidemia with target LDL less than 70 (Chronic)    Well controlled on Crestor 20mg  daily.  Pretty much at goal. -Continue Crestor 20mg  daily.        Other   Alcohol abuse (Chronic)    Discussed importance of cutting back alcohol consumption.  This will help her blood pressure and lipids.   Will also help her weight.      DOE (dyspnea on exertion) (Chronic)    Probably deconditioning related.  [place] did not have that much of a difficult time.  I suspect if she is able to walk more and quit smoking this will improve.  No chest pain, we will hold off on ischemic evaluation.      Family history of early CAD (Chronic)    Confirmed presence of CAD albeit nonobstructive on Coronary CTA.  There was a concerning plaque in the left main but FFR negative.  Continue aggressive risk factor modification.  Most difficult things to manage are his her own issues of smoking and alcohol as well as an activity.      Leg cramping (Chronic)    Reports of calf cramping, particularly when climbing stairs. No consistent pattern identified.  Does not seem to be related to statin.  Improved after stopping chlorthalidone. -Monitor for consistency and consider ABIs if symptoms persist.      Tobacco abuse (Chronic)    Somewhat in the precontemplative stage.  She knows she wants to quit-"wants to want to quit."  Smoking cessation instruction/counseling given:  counseled patient on the dangers of tobacco use, advised patient to stop smoking, and reviewed strategies to maximize success 4 MIN         General Health Maintenance -Continue Miralax as needed for bowel regularity. -Consider resuming Aspirin once hemorrhoidal bleeding resolves.         Dispo: Return in about 1 year (around 09/02/2024) for 1 Yr Follow-up.  Total time spent: 23 min spent with patient + 21 min spent charting = 44 min     Signed, Marykay Lex, MD, MS Bryan Lemma, M.D., M.S. Interventional Cardiologist  [facility]  Pager # [phone] Phone # [phone] [address] [place], [place] [address]

## 2023-09-03 NOTE — Assessment & Plan Note (Signed)
Discontinued Amlodipine due to gum swelling and pain. Unsuccessful trial of an unidentified diuretic due to side effects including blurry vision, leg cramps, and upset stomach. Current BP 140/80. Patient wishes to monitor BP at home and consider medication changes if necessary. -Continue Bystolic 20mg  daily. -Monitor BP at home, aiming for average readings less than 135/80. -Consider Spironolactone if BP remains elevated.

## 2023-09-03 NOTE — Assessment & Plan Note (Signed)
Confirmed presence of CAD albeit nonobstructive on Coronary CTA.  There was a concerning plaque in the left main but FFR negative.  Continue aggressive risk factor modification.  Most difficult things to manage are his her own issues of smoking and alcohol as well as an activity.

## 2023-09-03 NOTE — Patient Instructions (Signed)
Medication Instructions:  No changes    *If you need a refill on your cardiac medications before your next appointment, please call your pharmacy*   Lab Work: None    If you have labs (blood work) drawn today and your tests are completely normal, you will receive your results only by: MyChart Message (if you have MyChart) OR A paper copy in the mail If you have any lab test that is abnormal or we need to change your treatment, we will call you to review the results.   Testing/Procedures: None    Follow-Up: At [facility], you and your health needs are our priority.  As part of our continuing mission to provide you with exceptional heart care, we have created designated Provider Care Teams.  These Care Teams include your primary Cardiologist (physician) and Advanced Practice Providers (APPs -  Physician Assistants and Nurse Practitioners) who all work together to provide you with the care you need, when you need it.  We recommend signing up for the patient portal called "MyChart".  Sign up information is provided on this After Visit Summary.  MyChart is used to connect with patients for Virtual Visits (Telemedicine).  Patients are able to view lab/test results, encounter notes, upcoming appointments, etc.  Non-urgent messages can be sent to your provider as well.   To learn more about what you can do with MyChart, go to [email or web address].    Your next appointment:   1 year(s)  The format for your next appointment:   In Person  Provider:   Bryan Lemma, MD    Other Instructions Dr. Herbie Baltimore recommends daily exercise and following a well balanced diet.

## 2023-09-03 NOTE — Assessment & Plan Note (Signed)
Well controlled on Crestor 20mg  daily.  Pretty much at goal. -Continue Crestor 20mg  daily.

## 2023-09-03 NOTE — Assessment & Plan Note (Signed)
No active anginal symptoms.  Continue risk factor modification-discussed the importance of smoking cessation and BP control is her major difficulty control options. Discussed importance of cutting back alcohol.  Thankfully, her lipids have been well-controlled with most recent LDL of 64. Patient reports lack of exercise in recent months.  Plan: Still wants to avoid aspirin mostly because of concerns of ongoing hemorrhoidal bleeding. Continue rosuvastatin 20 mg daily.  Tolerating well.  Target LDL<70 if not<55. Unable to tolerate amlodipine, now is only on Bystolic 20 mg.  Would consider spironolactone as a next option although apparently she has had issues with any diuretic. Smoking cessation counseling Encouraged increased activity, specifically walking.

## 2023-09-03 NOTE — Assessment & Plan Note (Signed)
Discussed importance of cutting back alcohol consumption.  This will help her blood pressure and lipids.  Will also help her weight.

## 2023-09-03 NOTE — Assessment & Plan Note (Signed)
Reports of calf cramping, particularly when climbing stairs. No consistent pattern identified.  Does not seem to be related to statin.  Improved after stopping chlorthalidone. -Monitor for consistency and consider ABIs if symptoms persist.

## 2023-09-03 NOTE — Assessment & Plan Note (Signed)
Probably deconditioning related.  [place] did not have that much of a difficult time.  I suspect if she is able to walk more and quit smoking this will improve.  No chest pain, we will hold off on ischemic evaluation.

## 2023-09-03 NOTE — Assessment & Plan Note (Addendum)
Somewhat in the precontemplative stage.  She knows she wants to quit-"wants to want to quit."  Smoking cessation instruction/counseling given:  counseled patient on the dangers of tobacco use, advised patient to stop smoking, and reviewed strategies to maximize success 4 MIN

## 2023-11-15 ENCOUNTER — Other Ambulatory Visit: Payer: Self-pay

## 2023-11-15 MED ORDER — ROSUVASTATIN CALCIUM 20 MG PO TABS: ORAL_TABLET | ORAL | 2 refills | Status: DC

## 2024-05-11 ENCOUNTER — Other Ambulatory Visit: Payer: Self-pay | Admitting: Family Medicine

## 2024-05-11 DIAGNOSIS — Z1231 Encounter for screening mammogram for malignant neoplasm of breast: Secondary | ICD-10-CM

## 2024-05-17 ENCOUNTER — Ambulatory Visit
Admission: RE | Admit: 2024-05-17 | Discharge: 2024-05-17 | Disposition: A | Discharge status: Home / Self Care | Source: Ambulatory Visit | Attending: Family Medicine | Admitting: Family Medicine

## 2024-05-17 DIAGNOSIS — Z1231 Encounter for screening mammogram for malignant neoplasm of breast: Secondary | ICD-10-CM

## 2024-05-21 ENCOUNTER — Other Ambulatory Visit: Payer: Self-pay | Admitting: Cardiology

## 2024-05-23 ENCOUNTER — Other Ambulatory Visit: Payer: Self-pay | Admitting: Family Medicine

## 2024-05-23 DIAGNOSIS — R928 Other abnormal and inconclusive findings on diagnostic imaging of breast: Secondary | ICD-10-CM

## 2024-05-31 ENCOUNTER — Ambulatory Visit
Admission: RE | Admit: 2024-05-31 | Discharge: 2024-05-31 | Disposition: A | Discharge status: Home / Self Care | Source: Ambulatory Visit | Attending: Family Medicine | Admitting: Family Medicine

## 2024-05-31 DIAGNOSIS — R928 Other abnormal and inconclusive findings on diagnostic imaging of breast: Secondary | ICD-10-CM

## 2024-05-31 DIAGNOSIS — Z803 Family history of malignant neoplasm of breast: Secondary | ICD-10-CM | POA: Diagnosis not present

## 2024-05-31 DIAGNOSIS — N6342 Unspecified lump in left breast, subareolar: Secondary | ICD-10-CM | POA: Diagnosis not present

## 2024-09-25 DIAGNOSIS — I10 Essential (primary) hypertension: Secondary | ICD-10-CM | POA: Diagnosis not present

## 2024-09-28 DIAGNOSIS — E7800 Pure hypercholesterolemia, unspecified: Secondary | ICD-10-CM | POA: Diagnosis not present

## 2024-09-28 DIAGNOSIS — I10 Essential (primary) hypertension: Secondary | ICD-10-CM | POA: Diagnosis not present

## 2024-09-28 DIAGNOSIS — R928 Other abnormal and inconclusive findings on diagnostic imaging of breast: Secondary | ICD-10-CM | POA: Diagnosis not present

## 2024-09-28 DIAGNOSIS — Z131 Encounter for screening for diabetes mellitus: Secondary | ICD-10-CM | POA: Diagnosis not present

## 2024-10-09 DIAGNOSIS — I10 Essential (primary) hypertension: Secondary | ICD-10-CM | POA: Diagnosis not present

## 2024-10-09 DIAGNOSIS — Z720 Tobacco use: Secondary | ICD-10-CM | POA: Diagnosis not present
